# Patient Record
Sex: Female | Born: 1990 | Race: Black or African American | Hispanic: No | Marital: Single | Smoking: Current every day smoker
Health system: Southern US, Community
[De-identification: ages and names within clinical notes are randomized; demographics above are authoritative.]

## PROBLEM LIST (undated history)

## (undated) DIAGNOSIS — N83209 Unspecified ovarian cyst, unspecified side: Secondary | ICD-10-CM

## (undated) HISTORY — PX: OTHER SURGICAL HISTORY: SHX169

## (undated) HISTORY — PX: BREAST SURGERY: SHX581

---
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
## 2009-02-08 ENCOUNTER — Ambulatory Visit: Payer: Self-pay | Admitting: Family Medicine

## 2009-11-01 ENCOUNTER — Emergency Department: Payer: Self-pay | Admitting: Emergency Medicine

## 2010-07-21 ENCOUNTER — Emergency Department: Payer: Self-pay | Admitting: Emergency Medicine

## 2010-10-11 NOTE — H&P (Signed)
NAME:  Haley Osborne, Haley Osborne NO.:  [id number]   MEDICAL RECORD NO.:  [id number]          PATIENT TYPE:  IPS   LOCATION:  0107                          FACILITY:  [facility]   PHYSICIAN:  Lalla Brothers, MDDATE OF BIRTH:  01-Jul-1990   DATE OF ADMISSION:  06/20/2007  DATE OF DISCHARGE:                       PSYCHIATRIC ADMISSION ASSESSMENT   IDENTIFICATION:  A 20 year old female taking 10th and 11th grade classes  at [employer] is admitted emergently involuntarily on an  [facility] petition for commitment upon transfer from [facility] emergency department for inpatient stabilization  and treatment of suicide risk and depression.  The patient lacerated her  left wrist with a razor twice to die surrounding the breakup with  boyfriend and relative rejection by her best girlfriend.  She has been  suicidal the last week and progressively depressed the last 3 months.   HISTORY OF PRESENT ILLNESS:  The patient indicates that she has only two  real friends she is close to and otherwise avoid relationships, as  though having more relationships would devalue the ones she does have.  The patient reports melancholic depressive symptoms over the last 3  months, worse over the last week.  She also appears to have chronic  worry and over the last several months has had limited-symptom panic two  or three times monthly.  The patient stated that she could not take it  anymore.  She is not more specific about stressors, as she seems to find  these more painful and realistic if she talks about them.  In most way  she wants to be left alone, while obviously having the need to talk.  Much of her eccentric style of communication and relation come from  mother having schizophrenia and father having remarried and having other  children.  The patient has therefore become very close to boyfriend with  whom she has been related since the fourth grade.  She also  indicates  that her best girlfriend is named Sheryle Hail and that Sheryle Hail is like her  doctor and her teacher as well as her best friend all wrapped up in one.  The patient indicates that for whatever reason, Sheryle Hail and her  boyfriend have been rejecting or disconnecting from her recently.  The  patient seems to describe that she has become more depressed herself.  The patient reports diminished sleep and appetite with weight loss.  She  indicates diminished concentration and energy as well as diminished  interest.  Her grades are diminished and she has had helplessness and  hopelessness in her despair.  She has had crying spells and  progressively morbid fixations such that on the day of admission she has  fixated upon cutting herself.  The patient has had nightmares and worry.  She has dizzy spells with associated emesis treated with meclizine 25 mg  every 4 hours as needed.  She had been taking ibuprofen for headache.  The patient denies any organic central nervous system trauma.  She does  not acknowledge other specific psychic trauma, though she has had  longstanding and cumulative psychic trauma  associated with family mental  illness and progressive consequences.  The patient does not acknowledge  hallucinations or delusions.  Mother anticipates that the patient will  have schizophrenia, having studied the problem itself since she has it.  Mother will not state what medication she takes.  The patient is  somewhat apprehensive about medication.  She notes that mother smokes  excessively and mother has a significant and recurrent cough on the  phone.  The patient suggests she cannot tolerate or make it in the  hospital, either, as she is not used to talking to more people.  The  patient does not manifest manic diathesis.  There are no dissociative  symptoms or reexperiencing symptoms.  She has had no previous mental  health care.  Her urine drug screen and blood alcohol are negative.   She  smokes two or three cigarettes daily.  Mother has had suicide attempts.  The patient denies that father has any specific symptoms, although she  does not seem as close to father.   PAST MEDICAL HISTORY:  The patient reports having allergic rhinitis with  sinus involvement.  She reports chicken pox in the past.  She has had  irregular menses with last menses being in early December, with the  patient wondering if she is pregnant.  She has had two fibroadenomas  excised from the right breast, apparently in December 2008.  Blood  pressure is 140/90.  Last menses was early December 2008 and menses are  irregular.  The patient has negative urine pregnancy test.  Urinalysis  is normal with specific gravity of 1.015, though having 3+ ketones,  protein of 25, 0-5 wbc, and a few bacteria.  She has no medication  allergies.  She has some intertriginous rash over both medial thighs.  She has no seizure or syncope.  She has no heart murmur or arrhythmia.   REVIEW OF SYSTEMS:  The patient denies difficulty with gait, gaze or  continence.  She denies exposure to communicable disease or toxins.  She  denies rash, jaundice or purpura currently.  There is no chest pain,  palpitations or presyncope.  There is no abdominal pain, nausea,  vomiting or diarrhea.  There is no cough, congestion, dyspnea or  tachypnea, though mother has a chronic cough that patient attributes to  smoking cigarettes.  There is no dysuria or arthralgia.  There is no  headache or memory loss.  There is no sensory loss or coordination  deficit.   Immunizations are up-to-date.   FAMILY HISTORY:  Mother has schizophrenia and a history of suicide  attempts.  Father is remarried, having children with his current wife.  The patient's boyfriend, Kathlene November, and her best female friend, Sheryle Hail, have  both become alienating and detached from the patient recently.   SOCIAL AND DEVELOPMENTAL HISTORY:  The patient is taking 10th and 11th   grade classes at [employer].  She has been missing class  frequently.  She likes to sing.  The patient does not acknowledge sexual  activity but then indicates that she has frequent yeast infections and  needs treatment currently.  She denies alcohol or illicit drugs  currently, though she does smoke two to three cigarettes daily.  Urine  drug screen and blood alcohol are negative.   ASSETS:  The patient is social and sings.   MENTAL STATUS EXAM:  Height is 153.5 cm and weight is 48.5 kg.  Blood  pressure is 127/80 with heart rate of 95 sitting,  and 130/79 with heart  rate of 94 standing.  She is right-handed.  She is alert and oriented  with speech intact.  Cranial nerves II-XII are intact.  Muscle strength  and tone are normal.  There are no pathologic reflexes or soft  neurologic findings.  There are no abnormal involuntary movements.  Gait  and gaze are intact.  The patient is avoidant and inhibited as well as  withdrawn initially.  She appears significantly depressed and she  describes generalized anxiety.  She has diminished concentration,  interest, energy, eating and sleep.  She is helpless and hopeless with  diminished grades and states she cannot take it anymore.  She reports  weight loss, nightmares, crying spells and anxiety attacks two or three  times monthly.  She denies hallucinations and manifests no delusions or  paranoia.  Though symptoms could represent negative schizophrenic  symptoms, the patient does not manifest other features of schizophrenia,  though mother is watching the patient closely for these.  Rather, the  patient presents with severe dysphoria and moderate generalized anxiety  that is more severe at times.  She has no manic diathesis.  There is no  dissociation.  There is no other organicity.  The patient has cut her  wrist, acting upon a week-long suicidal ideation and depression of 3  months.   IMPRESSION:  AXIS I:  1. Major depression,  single episode, severe.  2. Generalized anxiety disorder.  3. Other interpersonal problem.  4. Parent-child problem.  5. Other specified family circumstances.  AXIS II:  Diagnosis deferred.  AXIS III:  1. Self-inflicted lacerations left wrist.  2. Irregular menses.  3. Dermatitis both thighs, likely intertriginous and/or monilial.  4. Cigarette smoking.  5. Recurrent monilial vaginitis by history.  6. Constipation.  AXIS IV:  Stressors family severe acute and chronic; phase of life  severe acute and chronic; peer relations severe acute and chronic.  AXIS V:  Global assessment of functioning on admission is 34 with  highest in last year 65.   PLAN:  The patient is admitted for inpatient adolescent psychiatric and  multidisciplinary multimodal behavioral health treatment in a team-based  programmatic locked psychiatric unit.  Celexa pharmacotherapy is  discussed with the patient and mother including FDA guidelines and  warnings.  They agree to starting 20 mg nightly.  Colace is ordered for  constipation rather than previous the lactulose that the patient  requests.  Cognitive behavioral therapy, anger management, interpersonal  therapy, grief and loss, object relations, individuation separation,  identity consolidation, social and communication skill training, and  problem-solving and coping-skill training can be undertaken.  Estimated  length stay is 7 days with target symptoms for discharge being  stabilization of suicide risk and mood, stabilization of generalized  anxiety, and generalization of the capacity for safe effective  participation in outpatient treatment.      Lalla Brothers, MD  Electronically Signed     GEJ/MEDQ  D:  06/21/2007  T:  06/22/2007  Job:  [id number]

## 2010-10-14 NOTE — Discharge Summary (Signed)
NAME:  Haley Osborne, Haley Osborne NO.:  [id number]   MEDICAL RECORD NO.:  [id number]          PATIENT TYPE:  INP   LOCATION:  0107                          FACILITY:  [facility]   PHYSICIAN:  Lalla Brothers, MDDATE OF BIRTH:  Mar 18, 1991   DATE OF ADMISSION:  06/20/2007  DATE OF DISCHARGE:  06/27/2007                               DISCHARGE SUMMARY   IDENTIFICATION:  [age]-year-old female, having 10th and 11th grade  classes at [employer], was admitted emergently involuntarily on  an [facility] petition for commitment upon transfer from [facility] Emergency Department for inpatient stabilization  and treatment of suicide risk and depression.  The patient lacerated her  left wrist with a razor twice in order to die, surrounding the breakup  with a boyfriend and the relative rejection by her best girlfriend.  She  had been suicidal over the last week and progressively depressed for the  less 3 months.  For full details, please see the typed admission  assessment.   SYNOPSIS OF PRESENT ILLNESS:  The patient resides with mother, who has  chronic schizophrenia, and 1 of 2 older brothers.  The patient suggests  that mother's schizophrenia started when she was poisoned [place] with  her husband, with symptoms coming on when she returned home.  Maternal  grandfather apparently also had schizophrenia.  Mother has concern at  times whether the patient will have schizophrenia.  The patient has some  contact with father, though he does not seem to follow through or  maintain consistent communication.  The patient has significant  caretaking for mother, who has cough from smoking and some urinary  incontinence possibly from alcohol according to the patient, though  mother denies.  Grades have been decreasing and the patient describes  melancholic depressive symptoms.  Still, she is opposed to being in the  hospital and working on these problems.   She has had worry and limited  symptom panic up to 3 times weekly, though without fully established  panic anxiety.  She describes an eccentric interpersonal style, limiting  herself to 2 friends mainly, as though she should not talk to others.  She suggests she talks only to these close friends and not significantly  to family.  She had been close to boyfriend since the 4th grade.  Mother  has had suicide attempts.  The patient has had excision of 3 breast  fibroadenomas as well as a irregular menses that may worry her as well.  The patient wonders if she is pregnant at the time of admission.  Father  has remarried, having children with his subsequent wife.   INITIAL MENTAL STATUS EXAM:  The patient appeared significantly  depressed and describes generalized anxiety as well, likely more  longstanding.  She is avoidant and inhibited, maintaining withdrawal.  She has diminished concentration, interest, energy, eating and sleep.  She presents hopeless and helpless with nightmares, weight loss, crying  spells and negative fixations.  She does not have psychosis or manic  symptoms.  There is no dissociation or post-traumatic stress.  There is  no  organicity evident.   LABORATORY FINDINGS:  At [facility] Emergency Department, CBC was normal  with white count 4900, hemoglobin 12.8, MCV of 89, MCH of 29.8 and  platelet count 299,000.  Comprehensive metabolic panel was normal,  except total bilirubin was slightly elevated at 1.2 with upper limit of  normal of 1 and CO2 of 30 with upper limit of normal 25.  Sodium was  normal at 139, potassium 3.4, random glucose 81, creatinine 0.93,  albumin 4.4, total protein 8.2, AST 16 and ALT 29.  Blood alcohol was  negative.  Urine drug screen was positive for cannabinoids, otherwise  negative.  Urine pregnancy test was negative.  Urinalysis was a poor  clean catch with specific gravity of 1.015 and 1+ bilirubin, 3+ ketones,  pH 7, protein of 25 mg/dL, many  epithelial, greater than 30 per high-  powered field, 0-5 wbc's and rbc's and 1+ bacteria.  At the behavioral  health center, hepatic function panel revealed indirect bilirubin to be  elevated at 1.2, with upper limit of normal 0.9 and direct bilirubin was  normal at 0.2.  AST remained normal at 21 and ALT 13 with GGT 20 and  albumin 4.2.  Free T4 was normal at 1.4 and TSH at 2.113.  RPR was  nonreactive and urine probe for gonorrhea and Chlamydia trachomatis by  DNA amplification were both negative.   HOSPITAL COURSE AND TREATMENT:  General medical exam by Jorje Guild, PA-C  noted that the patient has been taking meclizine 25 mg every 4-6 hours  as needed for dizziness that she thinks may be associated with anxiety  or blood pressure.  She reported constipation for the last 3 days.  She  reported menarche at age 66 with irregular menses, with last menses 7 or  8 weeks ago.  She had some superficial self-inflicted lacerations on the  left forearm and a missing left lower molar tooth.  She acknowledged  sexual activity, with last GYN exam in June 2008 with Dr. Wilford Corner.  The patient had multiple somatic complaints during the hospital stay  varying from nausea and vomiting with dizziness to vaginal discharge,  often for which she expected discharge.  She would often leave group to  check on these after initially predicting that she would not be able to  tolerate group or milieu therapies.  Her multiple somatic complaints  resulted in medical service treating her at various times with Advil,  Colace, Diflucan, Phenergan, MiraLax, Eucerin cream, Vistaril.  As the  patient's anxiety and depression began to improve, her somatic  complaints declined, though she had to  first decide to stay at the  hospital instead of expecting discharge.  The patient did gradually  engage in treatment.  She was afebrile throughout hospital stay with  maximum temperature 98.4.  Her height was 153.5 cm with  admission weight  and discharge weight being 48.5 kg.  Initial supine blood pressure was  112/64 with heart rate of 125 and standing blood pressure 120/75 with a  heart rate of 107.  At the time of discharge, supine blood pressure was  97/63 with heart rate of 68 and standing blood pressure 106/64 with  heart rate of 79.  The patient's multiple medication p.r.n.'s were  consolidated into restarting her meclizine at 25 mg three times daily.  She was started on Celexa after review with the patient and mother  including FDA guidelines and warnings, side-effects and proper use and  monitoring.  Celexa was titrated  up to 40 mg by June 25, 2007, taken  at bedtime and the patient reported that the increase in dose helped  significantly after the lower dose was not helping.  The patient  expected p.r.n. medication for tantrums when she received restriction  status due to receiving a phone call through her grandmother by her ex-  boyfriend, which is prohibited.  The patient worked through such  tantrums to become much more effective in the treatment process.  However, she was still having insomnia by the time of discharge and with  her dizziness problems before admission partially stabilized by  meclizine, temazepam p.r.n. seemed best.  Mother's social worker was  going to bring mother for discharge and she needed much hospital social  work help to secure the planned discharge.  Ultimately, mother did come  for discharge on the day scheduled after discussing whether grandmother  might do that.  Mother was accompanied by Child Protective Services at  pickup and the patient was pleased to be returning home.  Her wounds  were healing well and required no specialized care, other than  protection from further injury.  She is requesting a recheck by her  gynecologist from last June for vaginal discharge, though she was not  having any complaints for 4 days prior to discharge, as though these may   have been significantly associated with anxiety.   FINAL DIAGNOSES:   AXES I:  1. Major depression, single episode, severe, with melancholic      features.  2. Generalized anxiety disorder.  3. Other interpersonal problem.  4. Parent/child problem.  5. Other specified family circumstances.   AXIS II:  Diagnosis deferred.   AXES III:  1. Self-inflicted lacerations, left wrist.  2. Irregular menses.  3. Cigarette smoking.  4. Episodic vaginal discharge responding at a deferred fashion to      treatment for monilial.  5. Constipation  6. Episodic dizziness, most consistent with labyrinthine dysfunction.   AXIS IV:  Stressors, family, severe, acute and chronic; phase of life,  severe, acute and chronic; peer relations, severe, acute and chronic.   AXIS V:  Global assessment of functioning on admission 34 with highest  in last year 65 and discharge global assessment of functioning was 53.   PLAN:  The patient did take part in treatment much more effectively than  she had anticipated on admission.  She is discharged on a regular diet,  having no restrictions on physical activity unless related to episodes  of dizziness.  She requires no wound care, other than protecting the  injuries on the left forearm from any other additional injury.  She may  wish to see her gynecologist, Dr. Marvis Moeller, for followup.  She requires no  pain management.  Crisis and safety plans are outlined if needed.   DISCHARGE MEDICATIONS:  She is discharged on the following medication:  1. Citalopram 40 mg every bedtime, quantity #30 with 1 refill      prescribed.  2. Temazepam 7.5 mg at bedtime if needed for insomnia and dizziness,      quantity #30 with no refill prescribed.  3. Meclizine 25 mg every 6 hours when needed for dizziness and nausea,      currently being taken t.i.d., but being tapered with resolution,      quantity #60 with no refill.   AFTERCARE FOLLOWUP:  Will include her doctor for  dizziness that has also  treated sinus allergies according to the patient in the past.  Mental  health aftercare will be with [facility] , having an intake appointment  with Dallie Dad,  July 01, 2007 at 0930 at [phone].  They are  educated on the medication including FDA guidelines and warnings.      Lalla Brothers, MD  Electronically Signed     GEJ/MEDQ  D:  06/30/2007  T:  07/01/2007  Job:  [id number]   cc:   Eber Hong [address], [place], [place] [address] Dallie Dad,   [place], [facility] [place] [facility] Child [address] Cecilie Lowers Fax [phone], [address]

## 2011-02-17 LAB — HEPATIC FUNCTION PANEL
ALT: 13
AST: 21
Alkaline Phosphatase: 91
Bilirubin, Direct: 0.2
Indirect Bilirubin: 1.2 — ABNORMAL HIGH

## 2011-02-17 LAB — GAMMA GT: GGT: 20

## 2011-02-17 LAB — GC/CHLAMYDIA PROBE AMP, URINE: GC Probe Amp, Urine: NEGATIVE

## 2011-10-22 ENCOUNTER — Emergency Department: Payer: Self-pay | Admitting: Emergency Medicine

## 2011-10-22 LAB — BASIC METABOLIC PANEL
BUN: 13 mg/dL (ref 7–18)
Calcium, Total: 8.6 mg/dL (ref 8.5–10.1)
Chloride: 105 mmol/L (ref 98–107)
Creatinine: 1.04 mg/dL (ref 0.60–1.30)
Glucose: 74 mg/dL (ref 65–99)
Sodium: 140 mmol/L (ref 136–145)

## 2011-10-22 LAB — CBC
MCH: 30.3 pg (ref 26.0–34.0)
MCHC: 33.6 g/dL (ref 32.0–36.0)
MCV: 90 fL (ref 80–100)
RBC: 4.14 10*6/uL (ref 3.80–5.20)
RDW: 13.1 % (ref 11.5–14.5)
WBC: 6.1 10*3/uL (ref 3.6–11.0)

## 2012-03-08 ENCOUNTER — Ambulatory Visit: Payer: Self-pay | Admitting: Family Medicine

## 2012-10-07 ENCOUNTER — Emergency Department: Payer: Self-pay | Admitting: Emergency Medicine

## 2012-10-07 LAB — URINALYSIS, COMPLETE
Bilirubin,UR: NEGATIVE
Blood: NEGATIVE
Glucose,UR: NEGATIVE mg/dL (ref 0–75)
Ketone: NEGATIVE
Nitrite: NEGATIVE
RBC,UR: 2 /HPF (ref 0–5)
Specific Gravity: 1.024 (ref 1.003–1.030)
Squamous Epithelial: 14
WBC UR: 6 /HPF (ref 0–5)

## 2012-10-07 LAB — COMPREHENSIVE METABOLIC PANEL
Bilirubin,Total: 0.5 mg/dL (ref 0.2–1.0)
Calcium, Total: 8.8 mg/dL (ref 8.5–10.1)
Chloride: 107 mmol/L (ref 98–107)
Co2: 25 mmol/L (ref 21–32)
EGFR (Non-African Amer.): >60
Glucose: 76 mg/dL (ref 65–99)
Osmolality: 273 (ref 275–301)
SGOT(AST): 15 U/L (ref 15–37)
SGPT (ALT): 15 U/L (ref 12–78)

## 2012-10-07 LAB — HCG, QUANTITATIVE, PREGNANCY: Beta Hcg, Quant.: 5326 m[IU]/mL — ABNORMAL HIGH

## 2012-10-07 LAB — CBC
HCT: 35.5 % (ref 35.0–47.0)
MCH: 30.3 pg (ref 26.0–34.0)
MCV: 90 fL (ref 80–100)
RDW: 13.2 % (ref 11.5–14.5)

## 2012-10-07 LAB — WET PREP, GENITAL

## 2012-10-10 ENCOUNTER — Other Ambulatory Visit: Payer: Self-pay | Admitting: Obstetrics and Gynecology

## 2012-10-17 ENCOUNTER — Ambulatory Visit: Payer: Self-pay | Admitting: Obstetrics and Gynecology

## 2012-12-07 ENCOUNTER — Emergency Department: Payer: Self-pay | Admitting: Emergency Medicine

## 2012-12-07 LAB — COMPREHENSIVE METABOLIC PANEL
Albumin: 3.1 g/dL — ABNORMAL LOW (ref 3.4–5.0)
Alkaline Phosphatase: 55 U/L (ref 50–136)
BUN: 7 mg/dL (ref 7–18)
Calcium, Total: 8.9 mg/dL (ref 8.5–10.1)
Chloride: 103 mmol/L (ref 98–107)
Co2: 24 mmol/L (ref 21–32)
EGFR (Non-African Amer.): >60
Glucose: 109 mg/dL — ABNORMAL HIGH (ref 65–99)
Osmolality: 272 (ref 275–301)
Potassium: 3.3 mmol/L — ABNORMAL LOW (ref 3.5–5.1)
SGOT(AST): 23 U/L (ref 15–37)
SGPT (ALT): 26 U/L (ref 12–78)
Sodium: 137 mmol/L (ref 136–145)
Total Protein: 6.8 g/dL (ref 6.4–8.2)

## 2012-12-07 LAB — URINALYSIS, COMPLETE
Blood: NEGATIVE
Ketone: NEGATIVE
Ph: 6 (ref 4.5–8.0)
Protein: 30
RBC,UR: 3 /HPF (ref 0–5)
Specific Gravity: 1.025 (ref 1.003–1.030)
WBC UR: 18 /HPF (ref 0–5)

## 2012-12-07 LAB — CBC
HCT: 33.8 % — ABNORMAL LOW (ref 35.0–47.0)
MCV: 90 fL (ref 80–100)
Platelet: 285 10*3/uL (ref 150–440)
RBC: 3.75 10*6/uL — ABNORMAL LOW (ref 3.80–5.20)
RDW: 12.9 % (ref 11.5–14.5)
WBC: 10.5 10*3/uL (ref 3.6–11.0)

## 2012-12-07 LAB — HCG, QUANTITATIVE, PREGNANCY: Beta Hcg, Quant.: 110362 m[IU]/mL — ABNORMAL HIGH

## 2012-12-08 LAB — GC/CHLAMYDIA PROBE AMP

## 2012-12-10 LAB — URINE CULTURE

## 2014-05-26 IMAGING — CR DG CHEST 2V
1 series · 2 of 2 positions shown · non-contrast
Comparison: none

REASON FOR EXAM: productive cough, shortness of breath---denies
possibility of pregnancy
COMMENTS:   LMP: Three weeks ago

PROCEDURE:     DXR - DXR CHEST PA (OR AP) AND LATERAL  - October 22, 2011  [DATE]
RESULT:     The lung fields are clear. No pneumonia, pneumothorax or pleural
effusion is seen. The heart size is normal. Incidental note is made of a
slight lumbar scoliosis with convexity to the left.

[Series 1: w chest pa · 0.14mm/px · 2 of 2 slices shown]
[im 1/2]
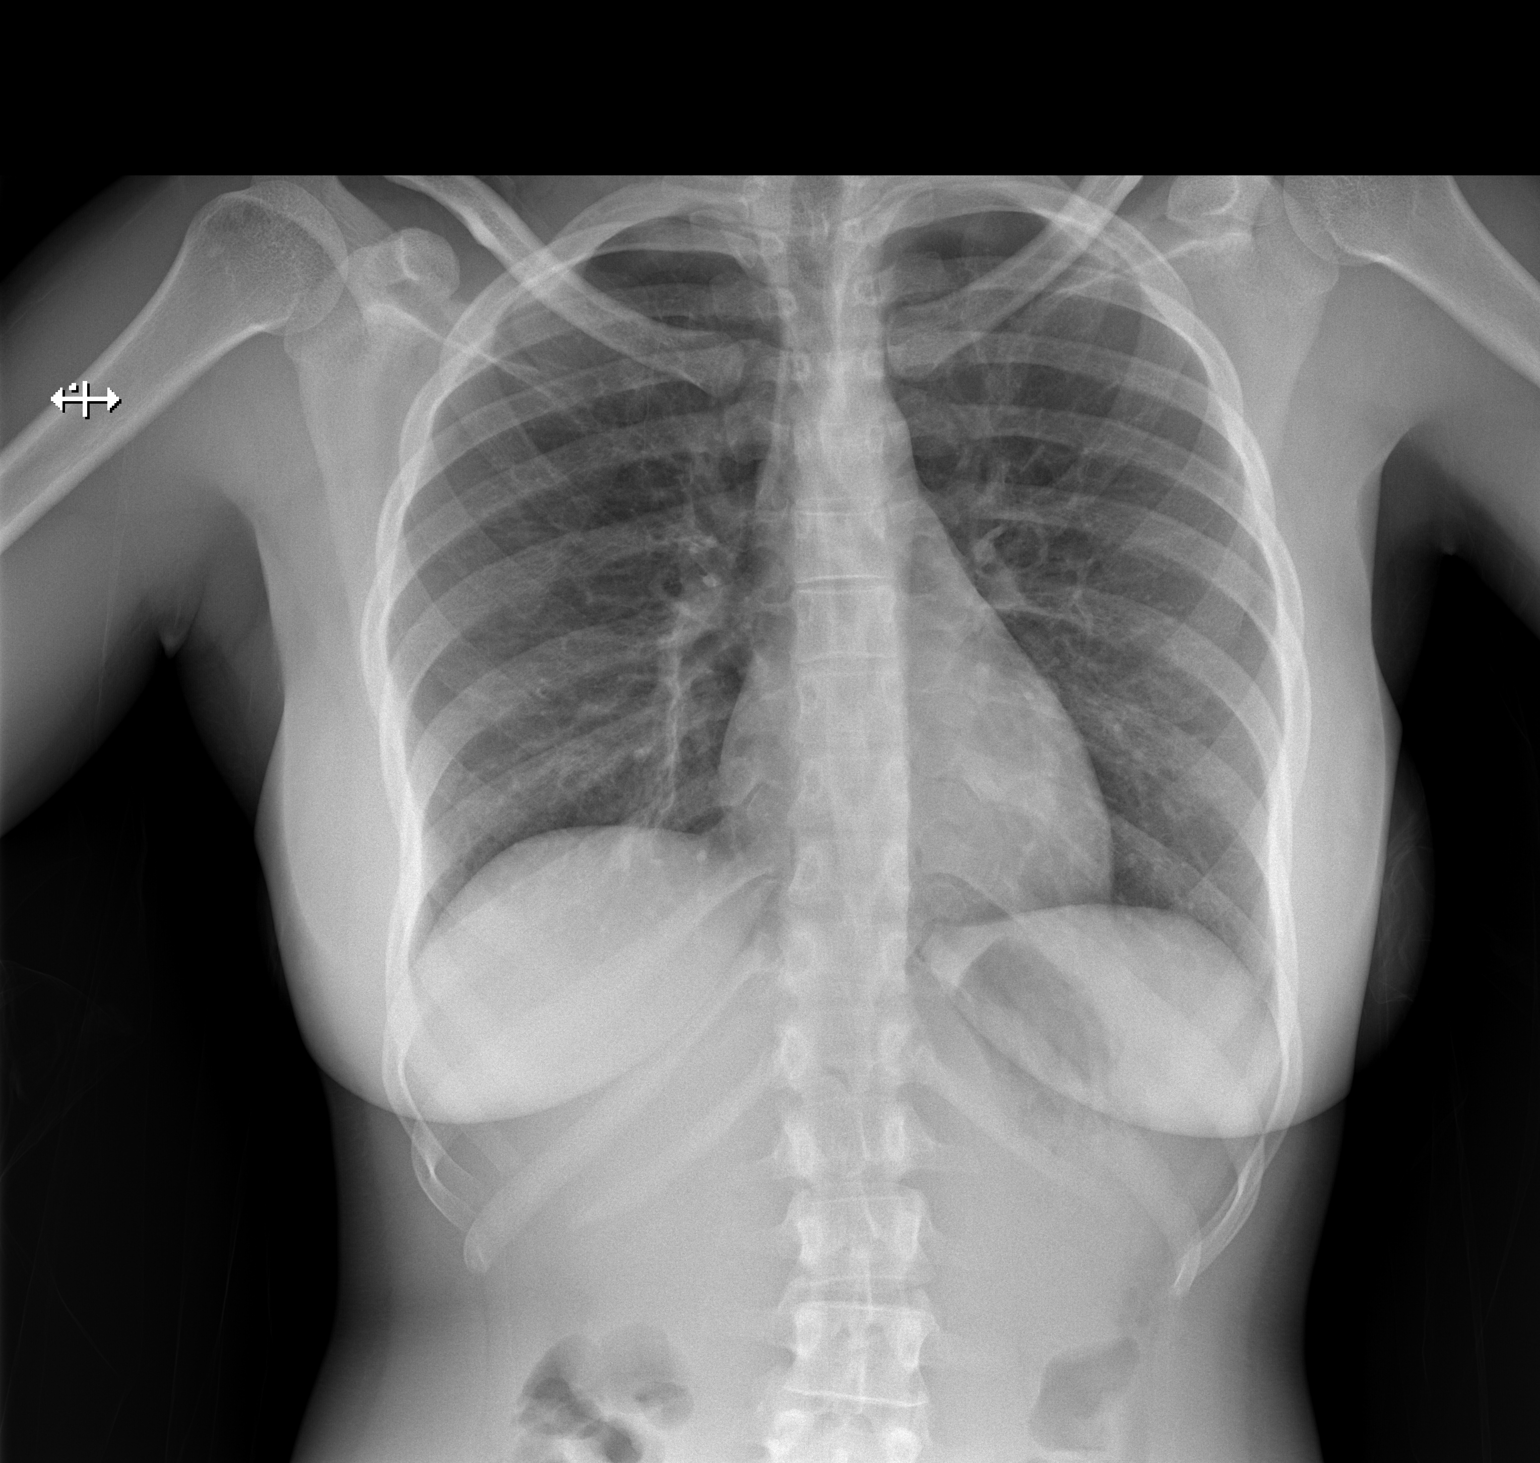
[im 2/2]
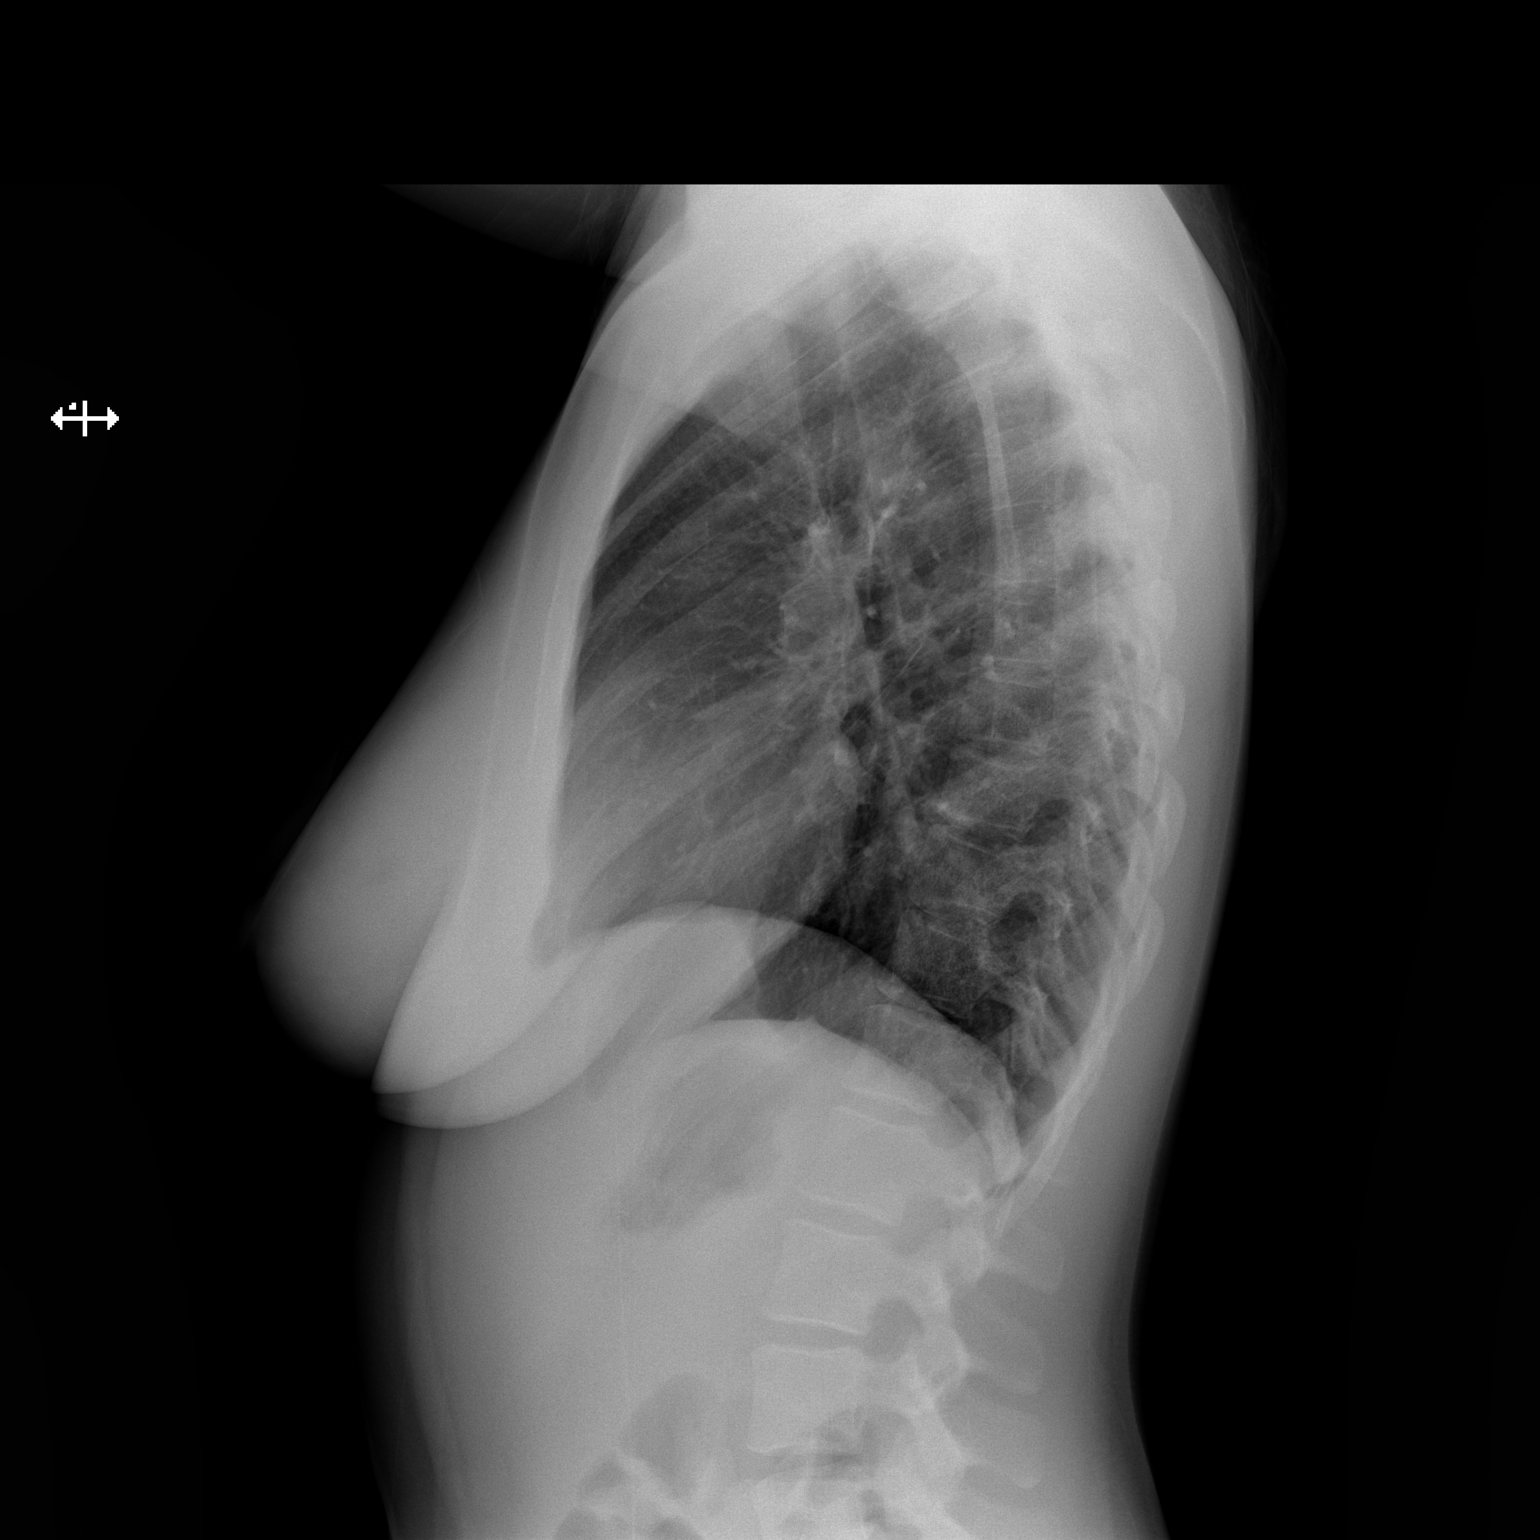

[2 of 2 positions shown; findings below may reference images not displayed]

IMPRESSION: No acute changes are identified.

Dictation site #2.

## 2014-08-01 ENCOUNTER — Emergency Department: Payer: Self-pay | Admitting: Emergency Medicine

## 2014-10-27 ENCOUNTER — Emergency Department
Admission: EM | Admit: 2014-10-27 | Discharge: 2014-10-27 | Discharge status: Against Medical Advice | Payer: Medicaid Other | Attending: Emergency Medicine | Admitting: Emergency Medicine

## 2014-10-27 DIAGNOSIS — R0602 Shortness of breath: Secondary | ICD-10-CM | POA: Insufficient documentation

## 2014-10-27 NOTE — ED Notes (Signed)
Pt here with c/o headache and SOB. Pt stats that she called EMS for the SOB tonight, but then refused transport. Pt states that she has periods of SOB and then it stops. Pt also have periods of felling like the room is spining. Pt was ambulatory to treatment room, pts vital stable at this time. Pt has also been vomiting.

## 2015-03-05 ENCOUNTER — Emergency Department
Admission: EM | Admit: 2015-03-05 | Discharge: 2015-03-05 | Disposition: A | Discharge status: Home / Self Care | Payer: Medicaid Other | Attending: Emergency Medicine | Admitting: Emergency Medicine

## 2015-03-05 ENCOUNTER — Encounter: Payer: Self-pay | Admitting: *Deleted

## 2015-03-05 ENCOUNTER — Emergency Department: Payer: Medicaid Other

## 2015-03-05 DIAGNOSIS — O9989 Other specified diseases and conditions complicating pregnancy, childbirth and the puerperium: Secondary | ICD-10-CM | POA: Diagnosis not present

## 2015-03-05 DIAGNOSIS — R102 Pelvic and perineal pain: Secondary | ICD-10-CM | POA: Diagnosis not present

## 2015-03-05 DIAGNOSIS — Z3A01 Less than 8 weeks gestation of pregnancy: Secondary | ICD-10-CM | POA: Diagnosis not present

## 2015-03-05 DIAGNOSIS — O26899 Other specified pregnancy related conditions, unspecified trimester: Secondary | ICD-10-CM

## 2015-03-05 DIAGNOSIS — O99331 Smoking (tobacco) complicating pregnancy, first trimester: Secondary | ICD-10-CM | POA: Diagnosis not present

## 2015-03-05 DIAGNOSIS — F17210 Nicotine dependence, cigarettes, uncomplicated: Secondary | ICD-10-CM | POA: Diagnosis not present

## 2015-03-05 LAB — COMPREHENSIVE METABOLIC PANEL
ALBUMIN: 3.8 g/dL (ref 3.5–5.0)
ALK PHOS: 54 U/L (ref 38–126)
ALT: 13 U/L — ABNORMAL LOW (ref 14–54)
ANION GAP: 7 (ref 5–15)
AST: 19 U/L (ref 15–41)
BILIRUBIN TOTAL: 0.7 mg/dL (ref 0.3–1.2)
BUN: 11 mg/dL (ref 6–20)
CALCIUM: 9 mg/dL (ref 8.9–10.3)
CO2: 25 mmol/L (ref 22–32)
Chloride: 106 mmol/L (ref 101–111)
Creatinine, Ser: 0.62 mg/dL (ref 0.44–1.00)
GFR calc Af Amer: >60 mL/min (ref >60)
GFR calc non Af Amer: >60 mL/min (ref >60)
GLUCOSE: 83 mg/dL (ref 65–99)
Potassium: 3.6 mmol/L (ref 3.5–5.1)
Sodium: 138 mmol/L (ref 135–145)
TOTAL PROTEIN: 6.4 g/dL — AB (ref 6.5–8.1)

## 2015-03-05 LAB — URINALYSIS COMPLETE WITH MICROSCOPIC (ARMC ONLY)
Bacteria, UA: NONE SEEN
Bilirubin Urine: NEGATIVE
GLUCOSE, UA: NEGATIVE mg/dL
Hgb urine dipstick: NEGATIVE
KETONES UR: NEGATIVE mg/dL
NITRITE: NEGATIVE
Protein, ur: NEGATIVE mg/dL
RBC / HPF: NONE SEEN RBC/hpf (ref 0–5)
SPECIFIC GRAVITY, URINE: 1.009 (ref 1.005–1.030)
pH: 7 (ref 5.0–8.0)

## 2015-03-05 LAB — TYPE AND SCREEN
ABO/RH(D): O POS
Antibody Screen: NEGATIVE

## 2015-03-05 LAB — CBC
HCT: 33 % — ABNORMAL LOW (ref 35.0–47.0)
HEMOGLOBIN: 10.9 g/dL — AB (ref 12.0–16.0)
MCH: 29.5 pg (ref 26.0–34.0)
MCHC: 33.1 g/dL (ref 32.0–36.0)
MCV: 89.2 fL (ref 80.0–100.0)
Platelets: 268 10*3/uL (ref 150–440)
RBC: 3.7 MIL/uL — ABNORMAL LOW (ref 3.80–5.20)
RDW: 13.7 % (ref 11.5–14.5)
WBC: 5.6 10*3/uL (ref 3.6–11.0)

## 2015-03-05 LAB — WET PREP, GENITAL
CLUE CELLS WET PREP: NONE SEEN
Trich, Wet Prep: NONE SEEN
YEAST WET PREP: NONE SEEN

## 2015-03-05 LAB — POCT PREGNANCY, URINE: PREG TEST UR: POSITIVE — AB

## 2015-03-05 LAB — ABO/RH: ABO/RH(D): O POS

## 2015-03-05 LAB — CHLAMYDIA/NGC RT PCR (ARMC ONLY)
Chlamydia Tr: NOT DETECTED
N GONORRHOEAE: NOT DETECTED

## 2015-03-05 LAB — LIPASE, BLOOD: Lipase: 28 U/L (ref 22–51)

## 2015-03-05 LAB — HCG, QUANTITATIVE, PREGNANCY: HCG, BETA CHAIN, QUANT, S: 2283 m[IU]/mL — AB (ref <5)

## 2015-03-05 MED ORDER — MORPHINE SULFATE (PF) 2 MG/ML IV SOLN
2.0000 mg | Freq: Once | INTRAVENOUS | Status: AC
  Administered 2015-03-05: 2 mg via INTRAVENOUS
  Filled 2015-03-05: qty 1

## 2015-03-05 MED ORDER — GNP PRENATAL VITAMINS 28-0.8 MG PO TABS: 1.0000 | ORAL_TABLET | Freq: Every day | ORAL | Status: DC

## 2015-03-05 NOTE — ED Provider Notes (Addendum)
[facility] Emergency Department Provider Note  ____________________________________________   I have reviewed the triage vital signs and the nursing notes.   HISTORY  Chief Complaint Abdominal Pain    HPI Haley Osborne is a 24 y.o. female presents today complaining of pelvic pain. She has had cramping for the last several weeks. Had spotting a few weeks ago. Does have a history of irregular periods. Is known to be Rh+ by outside lab blood work. See care everywhere.The patient has had no fever no chills no nausea or vomiting. She has had increased cramping today. She did not know she was pregnant but her urinary pregnancy test is negative. Her last menstrual period was the August 28, which would make her 5 weeks and 5 days. Patient is G1 P1 with no history of PID that she reports. Patient does not wish pain medication states that the cramping comes and goes and has been going and coming for weeks. No vaginal bleeding today and no presyncopal symptoms no fever no focal pain in the right lower quadrant most the pain is actually the middle to the left   History reviewed. No pertinent past medical history.  There are no active problems to display for this patient.   History reviewed. No pertinent past surgical history.  No current outpatient prescriptions on file.  Allergies Review of patient's allergies indicates no known allergies.  No family history on file.  Social History Social History  Substance Use Topics  . Smoking status: Current Every Day Smoker  . Smokeless tobacco: None  . Alcohol Use: No    Review of Systems Constitutional: No fever/chills Eyes: No visual changes. ENT: No sore throat. No stiff neck no neck pain Cardiovascular: Denies chest pain. Respiratory: Denies shortness of breath. Gastrointestinal:   no vomiting.  No diarrhea.  No constipation. Genitourinary: Negative for dysuria. Musculoskeletal: Negative lower extremity  swelling Skin: Negative for rash. Neurological: Negative for headaches, focal weakness or numbness. 10-point ROS otherwise negative.  ____________________________________________   PHYSICAL EXAM:  VITAL SIGNS: ED Triage Vitals  Enc Vitals Group     BP 03/05/15 1519 109/59 mmHg     Pulse Rate 03/05/15 1519 70     Resp 03/05/15 1519 16     Temp 03/05/15 1519 98 F (36.7 C)     Temp Source 03/05/15 1519 Oral     SpO2 03/05/15 1519 98 %     Weight --      Height --      Head Cir --      Peak Flow --      Pain Score 03/05/15 1516 8     Pain Loc --      Pain Edu? --      Excl. in GC? --     Constitutional: Alert and oriented. Well appearing and in no acute distress. Eyes: Conjunctivae are normal. PERRL. EOMI. Head: Atraumatic. Nose: No congestion/rhinnorhea. Mouth/Throat: Mucous membranes are moist.  Oropharynx non-erythematous. Neck: No stridor.   Nontender with no meningismus Cardiovascular: Normal rate, regular rhythm. Grossly normal heart sounds.  Good peripheral circulation. Respiratory: Normal respiratory effort.  No retractions. Lungs CTAB. Gastrointestinal: Soft anminimally tender to palpation in the suprapubic region, no istention. No guarding no rebound Back:  There is no focal tenderness or step off there is no midline tenderness there are no lesions noted. there is no CVA tenderness Pelvic exam: Female nurse chaperone present, no external lesions noted, physiologic vaginal discharge noted with no purulent discharge, no  cervical motion tenderness, no adnexal tenderness or mass, there is mild uterine tenderness or mass. No vaginal bleeding Musculoskeletal: No lower extremity tenderness. No joint effusions, no DVT signs strong distal pulses no edema Neurologic:  Normal speech and language. No gross focal neurologic deficits are appreciated.  Skin:  Skin is warm, dry and intact. No rash noted. Psychiatric: Mood and affect are normal. Speech and behavior are  normal.  ____________________________________________   LABS (all labs ordered are listed, but only abnormal results are displayed)  Labs Reviewed  COMPREHENSIVE METABOLIC PANEL - Abnormal; Notable for the following:    Total Protein 6.4 (*)    ALT 13 (*)    All other components within normal limits  CBC - Abnormal; Notable for the following:    RBC 3.70 (*)    Hemoglobin 10.9 (*)    HCT 33.0 (*)    All other components within normal limits  HCG, QUANTITATIVE, PREGNANCY - Abnormal; Notable for the following:    hCG, Beta Chain, Quant, S 2283 (*)    All other components within normal limits  POCT PREGNANCY, URINE - Abnormal; Notable for the following:    Preg Test, Ur POSITIVE (*)    All other components within normal limits  WET PREP, GENITAL  CHLAMYDIA/NGC RT PCR ([facility] ONLY)  LIPASE, BLOOD  URINALYSIS COMPLETEWITH MICROSCOPIC ([facility] ONLY)  POC URINE PREG, ED  TYPE AND SCREEN  ABO/RH   ____________________________________________  EKG   ____________________________________________  RADIOLOGY   ____________________________________________   PROCEDURES  Procedure(s) performed: None  Critical Care performed: None  ____________________________________________   INITIAL IMPRESSION / ASSESSMENT AND PLAN / ED COURSE  Pertinent labs & imaging results that were available during my care of the patient were reviewed by me and considered in my medical decision making (see chart for details).  There is no evidence of ectopic pregnancy, there is no evidence of PID there is no evidence of appendicitis or other acute intra-abdominal pathology at this time. Patient is having a cramping discomfort in the context of early pregnancy with a identified IUP consistent with dates. However we do not yet see the heartbeat, I will give patient precautions about feeding and increased pain and have her follow up on Monday with OB/GYN. Return precautions have been given and  understood  ----------------------------------------- 9:36 PM on 03/05/2015 -----------------------------------------  Patient eating and drinking no acute distress, serial abdominal exams are reassuring U to go home follow-up stressed, limitations of workup here understood, and return precautions understood ____________________________________________   FINAL CLINICAL IMPRESSION(S) / ED DIAGNOSES  Final diagnoses:  None     Jeanmarie Plant, MD 03/05/15 1610  Jeanmarie Plant, MD 03/05/15 2137

## 2015-03-05 NOTE — ED Notes (Signed)
Pt gone to ultrasound

## 2015-03-05 NOTE — Discharge Instructions (Signed)
Abdominal Pain During Pregnancy At this time, it appears that there is an early pregnancy, if you have increased pain, fever, vomiting, diarrhea, vaginal bleeding especially more than 1 pad an hour, or you feel worse in any way, please return to the emergency room Abdominal pain is common in pregnancy. Most of the time, it does not cause harm. There are many causes of abdominal pain. Some causes are more serious than others. Some of the causes of abdominal pain in pregnancy are easily diagnosed. Occasionally, the diagnosis takes time to understand. Other times, the cause is not determined. Abdominal pain can be a sign that something is very wrong with the pregnancy, or the pain may have nothing to do with the pregnancy at all. For this reason, always tell your health care provider if you have any abdominal discomfort. HOME CARE INSTRUCTIONS  Monitor your abdominal pain for any changes. The following actions may help to alleviate any discomfort you are experiencing:  Do not have sexual intercourse or put anything in your vagina until your symptoms go away completely.  Get plenty of rest until your pain improves.  Drink clear fluids if you feel nauseous. Avoid solid food as long as you are uncomfortable or nauseous.  Only take over-the-counter or prescription medicine as directed by your health care provider.  Keep all follow-up appointments with your health care provider. SEEK IMMEDIATE MEDICAL CARE IF:  You are bleeding, leaking fluid, or passing tissue from the vagina.  You have increasing pain or cramping.  You have persistent vomiting.  You have painful or bloody urination.  You have a fever.  You notice a decrease in your baby's movements.  You have extreme weakness or feel faint.  You have shortness of breath, with or without abdominal pain.  You develop a severe headache with abdominal pain.  You have abnormal vaginal discharge with abdominal pain.  You have persistent  diarrhea.  You have abdominal pain that continues even after rest, or gets worse. MAKE SURE YOU:   Understand these instructions.  Will watch your condition.  Will get help right away if you are not doing well or get worse.   This information is not intended to replace advice given to you by your health care provider. Make sure you discuss any questions you have with your health care provider.   Document Released: 05/15/2005 Document Revised: 03/05/2013 Document Reviewed: 12/12/2012 Elsevier Interactive Patient Education [employer]

## 2015-03-05 NOTE — ED Notes (Addendum)
Pt reports lower abdominal pain since this am. No nausea/vomiting/diarrhea. No urinary symptoms.

## 2016-03-02 ENCOUNTER — Other Ambulatory Visit: Payer: Self-pay | Admitting: Family Medicine

## 2016-03-02 ENCOUNTER — Ambulatory Visit
Admission: RE | Admit: 2016-03-02 | Discharge: 2016-03-02 | Disposition: A | Discharge status: Home / Self Care | Payer: Self-pay | Source: Ambulatory Visit | Attending: Family Medicine | Admitting: Family Medicine

## 2016-03-02 DIAGNOSIS — O0001 Abdominal pregnancy with intrauterine pregnancy: Secondary | ICD-10-CM

## 2016-03-02 DIAGNOSIS — Z3A01 Less than 8 weeks gestation of pregnancy: Secondary | ICD-10-CM | POA: Insufficient documentation

## 2016-03-02 DIAGNOSIS — O3481 Maternal care for other abnormalities of pelvic organs, first trimester: Secondary | ICD-10-CM | POA: Insufficient documentation

## 2016-03-02 DIAGNOSIS — Z3481 Encounter for supervision of other normal pregnancy, first trimester: Secondary | ICD-10-CM

## 2016-03-02 DIAGNOSIS — N8311 Corpus luteum cyst of right ovary: Secondary | ICD-10-CM | POA: Insufficient documentation

## 2016-05-15 ENCOUNTER — Emergency Department
Admission: EM | Admit: 2016-05-15 | Discharge: 2016-05-15 | Disposition: A | Discharge status: Home / Self Care | Payer: Medicaid Other | Attending: Emergency Medicine | Admitting: Emergency Medicine

## 2016-05-15 ENCOUNTER — Encounter: Payer: Self-pay | Admitting: Emergency Medicine

## 2016-05-15 ENCOUNTER — Emergency Department: Payer: Medicaid Other

## 2016-05-15 DIAGNOSIS — R1031 Right lower quadrant pain: Secondary | ICD-10-CM | POA: Diagnosis not present

## 2016-05-15 DIAGNOSIS — O26892 Other specified pregnancy related conditions, second trimester: Secondary | ICD-10-CM | POA: Insufficient documentation

## 2016-05-15 DIAGNOSIS — R109 Unspecified abdominal pain: Secondary | ICD-10-CM

## 2016-05-15 DIAGNOSIS — R102 Pelvic and perineal pain: Secondary | ICD-10-CM | POA: Insufficient documentation

## 2016-05-15 DIAGNOSIS — Z3A16 16 weeks gestation of pregnancy: Secondary | ICD-10-CM | POA: Insufficient documentation

## 2016-05-15 DIAGNOSIS — Z87891 Personal history of nicotine dependence: Secondary | ICD-10-CM | POA: Diagnosis not present

## 2016-05-15 HISTORY — DX: Unspecified ovarian cyst, unspecified side: N83.209

## 2016-05-15 LAB — COMPREHENSIVE METABOLIC PANEL
ALBUMIN: 3.5 g/dL (ref 3.5–5.0)
ALK PHOS: 50 U/L (ref 38–126)
ALT: 14 U/L (ref 14–54)
AST: 22 U/L (ref 15–41)
Anion gap: 6 (ref 5–15)
BILIRUBIN TOTAL: 0.3 mg/dL (ref 0.3–1.2)
BUN: 9 mg/dL (ref 6–20)
CALCIUM: 9.1 mg/dL (ref 8.9–10.3)
CO2: 27 mmol/L (ref 22–32)
Chloride: 101 mmol/L (ref 101–111)
Creatinine, Ser: 0.58 mg/dL (ref 0.44–1.00)
GFR calc Af Amer: >60 mL/min (ref >60)
GFR calc non Af Amer: >60 mL/min (ref >60)
GLUCOSE: 78 mg/dL (ref 65–99)
Potassium: 3.6 mmol/L (ref 3.5–5.1)
Sodium: 134 mmol/L — ABNORMAL LOW (ref 135–145)
TOTAL PROTEIN: 7 g/dL (ref 6.5–8.1)

## 2016-05-15 LAB — CBC
HCT: 35.7 % (ref 35.0–47.0)
Hemoglobin: 12 g/dL (ref 12.0–16.0)
MCH: 30.4 pg (ref 26.0–34.0)
MCHC: 33.7 g/dL (ref 32.0–36.0)
MCV: 90.3 fL (ref 80.0–100.0)
Platelets: 312 10*3/uL (ref 150–440)
RBC: 3.96 MIL/uL (ref 3.80–5.20)
RDW: 13.6 % (ref 11.5–14.5)
WBC: 8.2 10*3/uL (ref 3.6–11.0)

## 2016-05-15 LAB — URINALYSIS, COMPLETE (UACMP) WITH MICROSCOPIC
Bacteria, UA: NONE SEEN
Bilirubin Urine: NEGATIVE
GLUCOSE, UA: NEGATIVE mg/dL
HGB URINE DIPSTICK: NEGATIVE
Ketones, ur: NEGATIVE mg/dL
NITRITE: NEGATIVE
Protein, ur: NEGATIVE mg/dL
RBC / HPF: NONE SEEN RBC/hpf (ref 0–5)
SPECIFIC GRAVITY, URINE: 1.02 (ref 1.005–1.030)
pH: 6 (ref 5.0–8.0)

## 2016-05-15 LAB — HCG, QUANTITATIVE, PREGNANCY: HCG, BETA CHAIN, QUANT, S: 18475 m[IU]/mL — AB (ref <5)

## 2016-05-15 LAB — LIPASE, BLOOD: Lipase: 16 U/L (ref 11–51)

## 2016-05-15 MED ORDER — ACETAMINOPHEN 500 MG PO TABS
1000.0000 mg | ORAL_TABLET | Freq: Once | ORAL | Status: AC
  Administered 2016-05-15: 1000 mg via ORAL

## 2016-05-15 MED ORDER — ACETAMINOPHEN 500 MG PO TABS
ORAL_TABLET | ORAL | Status: AC
  Administered 2016-05-15: 1000 mg via ORAL
  Filled 2016-05-15: qty 2

## 2016-05-15 NOTE — ED Provider Notes (Signed)
[facility] Emergency Department Provider Note   ____________________________________________   I have reviewed the triage vital signs and the nursing notes.   HISTORY  Chief Complaint Abdominal Pain   History limited by: Not Limited   HPI Haley Osborne is a 25 y.o. female, at roughly [redacted] weeks pregnant, who presents to the emergency department today at the request of her PCP's office because of concern for right lower quadrant pain. The patient went to her PCP's office this morning for the pain. It started yesterday evening. Has been somewhat intermittent since then. It will become severe. The patient denies any unusual activity recently. It is worse with standing. No nausea or vomiting. No fevers. No abnormal vaginal discharge or dysuria.    Past Medical History:  Diagnosis Date  . Ovarian cyst     There are no active problems to display for this patient.   Past Surgical History:  Procedure Laterality Date  . BREAST SURGERY    . miscarriages      Prior to Admission medications   Medication Sig Start Date End Date Taking? Authorizing Provider  Prenatal Vit-Fe Fumarate-FA (GNP PRENATAL VITAMINS) 28-0.8 MG TABS Take 1 tablet by mouth daily. 03/05/15   Jeanmarie PlantJames A McShane, MD    Allergies Patient has no known allergies.  No family history on file.  Social History Social History  Substance Use Topics  . Smoking status: Former Games developermoker  . Smokeless tobacco: Not on file  . Alcohol use No    Review of Systems  Constitutional: Negative for fever. Cardiovascular: Negative for chest pain. Respiratory: Negative for shortness of breath. Gastrointestinal: Positive for right lower quadrant pain. Genitourinary: Negative for dysuria. Musculoskeletal: Negative for back pain. Skin: Negative for rash. Neurological: Negative for headaches, focal weakness or numbness.  10-point ROS otherwise  negative.  ____________________________________________   PHYSICAL EXAM:  VITAL SIGNS: ED Triage Vitals  Enc Vitals Group     BP 05/15/16 1132 107/62     Pulse Rate 05/15/16 1132 98     Resp 05/15/16 1132 20     Temp 05/15/16 1132 98.4 F (36.9 C)     Temp Source 05/15/16 1132 Oral     SpO2 05/15/16 1132 98 %     Weight 05/15/16 1133 137 lb (62.1 kg)     Height 05/15/16 1133 4\' 11"  (1.499 m)     Head Circumference --      Peak Flow --      Pain Score 05/15/16 1133 8    Constitutional: Alert and oriented. Well appearing and in no distress. Eyes: Conjunctivae are normal. Normal extraocular movements. ENT   Head: Normocephalic and atraumatic.   Nose: No congestion/rhinnorhea.   Mouth/Throat: Mucous membranes are moist.   Neck: No stridor. Hematological/Lymphatic/Immunilogical: No cervical lymphadenopathy. Cardiovascular: Normal rate, regular rhythm.  No murmurs, rubs, or gallops.  Respiratory: Normal respiratory effort without tachypnea nor retractions. Breath sounds are clear and equal bilaterally. No wheezes/rales/rhonchi. Gastrointestinal: Soft. Gravid. Tender to palpation in the right lower quadrant. Genitourinary: Deferred Musculoskeletal: Normal range of motion in all extremities. No lower extremity edema. Neurologic:  Normal speech and language. No gross focal neurologic deficits are appreciated.  Skin:  Skin is warm, dry and intact. No rash noted. Psychiatric: Mood and affect are normal. Speech and behavior are normal. Patient exhibits appropriate insight and judgment.  ____________________________________________    LABS (pertinent positives/negatives)  Labs Reviewed  COMPREHENSIVE METABOLIC PANEL - Abnormal; Notable for the following:  Result Value   Sodium 134 (*)    All other components within normal limits  URINALYSIS, COMPLETE (UACMP) WITH MICROSCOPIC - Abnormal; Notable for the following:    Color, Urine YELLOW (*)    APPearance HAZY  (*)    Leukocytes, UA SMALL (*)    Squamous Epithelial / LPF 6-30 (*)    All other components within normal limits  HCG, QUANTITATIVE, PREGNANCY - Abnormal; Notable for the following:    hCG, Beta Chain, Quant, S 18,475 (*)    All other components within normal limits  LIPASE, BLOOD  CBC     ____________________________________________   EKG  None  ____________________________________________    RADIOLOGY  US abd IMPRESSION:  1. Single living intrauterine pregnancy with gestational age of [redacted]  weeks 2 days.  2. No evidence of right ovarian torsion. Left ovary not visualized.      ____________________________________________   PROCEDURES  Procedures  ____________________________________________   INITIAL IMPRESSION / ASSESSMENT AND PLAN / ED COURSE  Pertinent labs & imaging results that were available during my care of the patient were reviewed by me and considered in my medical decision making (see chart for details).  Patient presented for primary care doctor's office because of concerns for right lower quadrant abdominal pain. Exam she is minimally tender there. Ultrasound was performed which did not show any acute abnormality. At this point had a discussion with the patient. We discussed possibility of appendicitis. This point patient is afebrile. She has no leukocytosis. No nausea or vomiting. Patient was comfortable not pursuing any further diagnostics at this point. We did discuss many of these symptoms since return precautions.  ____________________________________________   FINAL CLINICAL IMPRESSION(S) / ED DIAGNOSES  Final diagnoses:  Abdominal pain     Note: This dictation was prepared with Dragon dictation. Any transcriptional errors that result from this process are unintentional     Phineas SemenGraydon Qamar Aughenbaugh, MD 05/15/16 1704

## 2016-05-15 NOTE — ED Triage Notes (Signed)
Lower R abdominal since last night. [redacted] weeks pregnant. 4th pregnancy. No vaginal bleeding or fluid leaking. Denies dysuria. Has had abdominal craimping for entire pregnancy.

## 2016-05-15 NOTE — Discharge Instructions (Signed)
Please seek medical attention for any high fevers, chest pain, shortness of breath, change in behavior, persistent vomiting, bloody stool or any other new or concerning symptoms.  

## 2016-11-12 ENCOUNTER — Emergency Department
Admission: EM | Admit: 2016-11-12 | Discharge: 2016-11-12 | Disposition: A | Discharge status: Home / Self Care | Payer: Medicaid Other | Attending: Emergency Medicine | Admitting: Emergency Medicine

## 2016-11-12 ENCOUNTER — Encounter: Payer: Self-pay | Admitting: Emergency Medicine

## 2016-11-12 DIAGNOSIS — J029 Acute pharyngitis, unspecified: Secondary | ICD-10-CM | POA: Diagnosis present

## 2016-11-12 DIAGNOSIS — J020 Streptococcal pharyngitis: Secondary | ICD-10-CM | POA: Diagnosis not present

## 2016-11-12 MED ORDER — AMOXICILLIN-POT CLAVULANATE 875-125 MG PO TABS: 1.0000 | ORAL_TABLET | Freq: Two times a day (BID) | ORAL | 0 refills | Status: DC

## 2016-11-12 MED ORDER — MAGIC MOUTHWASH W/LIDOCAINE: 10.0000 mL | Freq: Four times a day (QID) | ORAL | 0 refills | Status: DC | PRN

## 2016-11-12 NOTE — ED Provider Notes (Signed)
[facility] Emergency Department Provider Note  ____________________________________________  Time seen: Approximately 7:13 AM  I have reviewed the triage vital signs and the nursing notes.   HISTORY  Chief Complaint Sore Throat    HPI Haley Osborne is a 26 y.o. female who presents to the emergency department for evaluation of sore throat that started yesterday. No known fever. She is 4 weeks postpartum and nursing. She has taken ibuprofen without significant relief.  Past Medical History:  Diagnosis Date  . Ovarian cyst     There are no active problems to display for this patient.   Past Surgical History:  Procedure Laterality Date  . BREAST SURGERY    . miscarriages      Prior to Admission medications   Medication Sig Start Date End Date Taking? Authorizing Provider  amoxicillin-clavulanate (AUGMENTIN) 875-125 MG tablet Take 1 tablet by mouth 2 (two) times daily. 11/12/16   Myalynn Lingle, Rulon Eisenmengerari B, FNP  magic mouthwash w/lidocaine SOLN Take 10 mLs by mouth 4 (four) times daily as needed for mouth pain. 11/12/16   Fielding Mault, Kasandra Knudsenari B, FNP  Prenatal Vit-Fe Fumarate-FA (GNP PRENATAL VITAMINS) 28-0.8 MG TABS Take 1 tablet by mouth daily. 03/05/15   Jeanmarie PlantMcShane, James A, MD    Allergies Shrimp [shellfish allergy]  No family history on file.  Social History Social History  Substance Use Topics  . Smoking status: Former Games developermoker  . Smokeless tobacco: Never Used  . Alcohol use No    Review of Systems Constitutional: Negative for fever. Eyes: No visual changes. ENT: Positive for sore throat; negative for difficulty swallowing. Respiratory: Denies shortness of breath. Gastrointestinal: No abdominal pain.  No nausea, no vomiting. Musculoskeletal: Negataive for generalized body aches. Skin: Negataive for rash. Neurological: Negative for headaches, focal weakness or numbness.  ____________________________________________   PHYSICAL EXAM:  VITAL  SIGNS: ED Triage Vitals  Enc Vitals Group     BP 11/12/16 0705 118/83     Pulse Rate 11/12/16 0705 (!) 105     Resp 11/12/16 0705 20     Temp 11/12/16 0705 99.1 F (37.3 C)     Temp Source 11/12/16 0705 Oral     SpO2 11/12/16 0705 99 %     Weight 11/12/16 0707 148 lb (67.1 kg)     Height 11/12/16 0707 4\' 11"  (1.499 m)     Head Circumference --      Peak Flow --      Pain Score 11/12/16 0705 10     Pain Loc --      Pain Edu? --      Excl. in GC? --    Constitutional: Alert and oriented. Well appearing and in no acute distress. Eyes: Conjunctivae are normal. PERRL. EOMI. Head: Atraumatic. Nose: No congestion/rhinnorhea. Mouth/Throat: Mucous membranes are moist.  Oropharynx erythematous, tonsils 2+ with large amount of exudate. Neck: No stridor.  Lymphatic: Lymphadenopathy: Bilateral anterior cervical lymphadenopathy palpable. Cardiovascular: Normal rate, regular rhythm. Good peripheral circulation. Respiratory: Normal respiratory effort. Lungs CTAB. Gastrointestinal: Soft and nontender. Musculoskeletal: No lower extremity tenderness nor edema.   Neurologic:  Normal speech and language. No gross focal neurologic deficits are appreciated. Speech is normal. No gait instability. Skin:  Skin is warm, dry and intact. No rash noted Psychiatric: Mood and affect are normal. Speech and behavior are normal.  ____________________________________________   LABS (all labs ordered are listed, but only abnormal results are displayed)  Labs Reviewed - No data to display ____________________________________________  EKG   ____________________________________________  RADIOLOGY   ____________________________________________   PROCEDURES  Procedure(s) performed: None  Critical Care performed: No ____________________________________________   INITIAL IMPRESSION / ASSESSMENT AND PLAN / ED COURSE  Pertinent labs & imaging results that were available during my care of the patient  were reviewed by me and considered in my medical decision making (see chart for details).  26 year old female presenting to the ER for evaluation and treatment of sore throat. Exam and symptoms consistent with strep pharyngitis. She will be treated with Augmentin and magic mouthwash without diphenhydramine. She was advised to also take tylenol and/or ibuprofen if needed. She is to return to the ER for symptoms that are not improving over the next 2-3 days if unable to see the PCP. ____________________________________________  Discharge Medication List as of 11/12/2016  7:22 AM    START taking these medications   Details  amoxicillin-clavulanate (AUGMENTIN) 875-125 MG tablet Take 1 tablet by mouth 2 (two) times daily., Starting Sun 11/12/2016, Print    magic mouthwash w/lidocaine SOLN Take 10 mLs by mouth 4 (four) times daily as needed for mouth pain., Starting Sun 11/12/2016, Print        FINAL CLINICAL IMPRESSION(S) / ED DIAGNOSES  Final diagnoses:  Strep pharyngitis    If controlled substance prescribed during this visit, 12 month history viewed on the [facility] prior to issuing an initial prescription for Schedule II or III opiod.   Note:  This document was prepared using Dragon voice recognition software and may include unintentional dictation errors.    Chinita Pester, FNP 11/12/16 1610    Phineas Semen, MD 11/12/16 1226

## 2016-11-12 NOTE — Discharge Instructions (Signed)
You may also take tylenol or ibuprofen in addition to the mouthwash. See your PCP if not improving over the next 2-3 days or return to the ER for symptoms that change or worsen.

## 2016-11-12 NOTE — ED Notes (Signed)
See triage note  States she developed sore throat 2 days ago  Pain increased this am with swallowing  Unsure of fever at home but did have chills  Low grade fever on arrival

## 2016-11-12 NOTE — ED Triage Notes (Signed)
Sore throat since yesterday.

## 2017-03-19 ENCOUNTER — Emergency Department
Admission: EM | Admit: 2017-03-19 | Discharge: 2017-03-19 | Disposition: A | Discharge status: Home / Self Care | Payer: Medicaid Other | Attending: Emergency Medicine | Admitting: Emergency Medicine

## 2017-03-19 ENCOUNTER — Emergency Department: Payer: Medicaid Other

## 2017-03-19 ENCOUNTER — Encounter: Payer: Self-pay | Admitting: Emergency Medicine

## 2017-03-19 DIAGNOSIS — Y999 Unspecified external cause status: Secondary | ICD-10-CM | POA: Diagnosis not present

## 2017-03-19 DIAGNOSIS — Z79899 Other long term (current) drug therapy: Secondary | ICD-10-CM | POA: Diagnosis not present

## 2017-03-19 DIAGNOSIS — F17210 Nicotine dependence, cigarettes, uncomplicated: Secondary | ICD-10-CM | POA: Diagnosis not present

## 2017-03-19 DIAGNOSIS — Y92410 Unspecified street and highway as the place of occurrence of the external cause: Secondary | ICD-10-CM | POA: Insufficient documentation

## 2017-03-19 DIAGNOSIS — S6991XA Unspecified injury of right wrist, hand and finger(s), initial encounter: Secondary | ICD-10-CM

## 2017-03-19 DIAGNOSIS — S63501A Unspecified sprain of right wrist, initial encounter: Secondary | ICD-10-CM | POA: Insufficient documentation

## 2017-03-19 DIAGNOSIS — Y9389 Activity, other specified: Secondary | ICD-10-CM | POA: Insufficient documentation

## 2017-03-19 MED ORDER — MELOXICAM 15 MG PO TABS: 15.0000 mg | ORAL_TABLET | Freq: Every day | ORAL | 0 refills | Status: DC
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
## 2017-03-19 NOTE — ED Notes (Signed)
Right finger #2 oozing blood at tip. Able to move

## 2017-05-09 ENCOUNTER — Encounter: Payer: Self-pay | Admitting: Emergency Medicine

## 2017-05-09 DIAGNOSIS — N8301 Follicular cyst of right ovary: Secondary | ICD-10-CM | POA: Insufficient documentation

## 2017-05-09 DIAGNOSIS — F17210 Nicotine dependence, cigarettes, uncomplicated: Secondary | ICD-10-CM | POA: Diagnosis not present

## 2017-05-09 DIAGNOSIS — Z791 Long term (current) use of non-steroidal anti-inflammatories (NSAID): Secondary | ICD-10-CM | POA: Insufficient documentation

## 2017-05-09 DIAGNOSIS — R102 Pelvic and perineal pain: Secondary | ICD-10-CM | POA: Diagnosis present

## 2017-05-09 LAB — POCT PREGNANCY, URINE: PREG TEST UR: NEGATIVE

## 2017-05-09 LAB — COMPREHENSIVE METABOLIC PANEL
ALK PHOS: 83 U/L (ref 38–126)
ALT: 13 U/L — AB (ref 14–54)
AST: 21 U/L (ref 15–41)
Albumin: 4 g/dL (ref 3.5–5.0)
Anion gap: 9 (ref 5–15)
BILIRUBIN TOTAL: 0.7 mg/dL (ref 0.3–1.2)
BUN: 10 mg/dL (ref 6–20)
CALCIUM: 9.4 mg/dL (ref 8.9–10.3)
CO2: 26 mmol/L (ref 22–32)
CREATININE: 0.77 mg/dL (ref 0.44–1.00)
Chloride: 103 mmol/L (ref 101–111)
Glucose, Bld: 90 mg/dL (ref 65–99)
Potassium: 3.4 mmol/L — ABNORMAL LOW (ref 3.5–5.1)
Sodium: 138 mmol/L (ref 135–145)
TOTAL PROTEIN: 7.1 g/dL (ref 6.5–8.1)

## 2017-05-09 LAB — CBC
HCT: 36.4 % (ref 35.0–47.0)
Hemoglobin: 12.4 g/dL (ref 12.0–16.0)
MCH: 30.4 pg (ref 26.0–34.0)
MCHC: 34.2 g/dL (ref 32.0–36.0)
MCV: 88.8 fL (ref 80.0–100.0)
PLATELETS: 270 10*3/uL (ref 150–440)
RBC: 4.1 MIL/uL (ref 3.80–5.20)
RDW: 12.9 % (ref 11.5–14.5)
WBC: 4.2 10*3/uL (ref 3.6–11.0)

## 2017-05-09 LAB — LIPASE, BLOOD: Lipase: 22 U/L (ref 11–51)

## 2017-05-09 NOTE — ED Triage Notes (Signed)
Pt comes into the ED via POV c/o right lower abdominal pain that started at 13:00 today.  Patient has had intermittent nausea accompanied with the pain.  Denies any lower back pain, diarrhea, or chest pain.  H/o ovarian cysts in the past.  Patient ambulatory to triage at this time and has even and unlabored respirations.

## 2017-05-10 ENCOUNTER — Emergency Department: Payer: Medicaid Other

## 2017-05-10 ENCOUNTER — Emergency Department
Admission: EM | Admit: 2017-05-10 | Discharge: 2017-05-10 | Disposition: A | Discharge status: Home / Self Care | Payer: Medicaid Other | Attending: Emergency Medicine | Admitting: Emergency Medicine

## 2017-05-10 DIAGNOSIS — N83201 Unspecified ovarian cyst, right side: Secondary | ICD-10-CM

## 2017-05-10 DIAGNOSIS — R1031 Right lower quadrant pain: Secondary | ICD-10-CM

## 2017-05-10 LAB — URINALYSIS, COMPLETE (UACMP) WITH MICROSCOPIC
Bacteria, UA: NONE SEEN
Bilirubin Urine: NEGATIVE
GLUCOSE, UA: NEGATIVE mg/dL
HGB URINE DIPSTICK: NEGATIVE
Ketones, ur: NEGATIVE mg/dL
NITRITE: NEGATIVE
PROTEIN: 30 mg/dL — AB
SPECIFIC GRAVITY, URINE: 1.026 (ref 1.005–1.030)
pH: 6 (ref 5.0–8.0)

## 2017-05-10 MED ORDER — KETOROLAC TROMETHAMINE 30 MG/ML IJ SOLN
30.0000 mg | Freq: Once | INTRAMUSCULAR | Status: AC
  Administered 2017-05-10: 30 mg via INTRAVENOUS
  Filled 2017-05-10: qty 1

## 2017-05-10 MED ORDER — ONDANSETRON HCL 4 MG/2ML IJ SOLN
4.0000 mg | Freq: Once | INTRAMUSCULAR | Status: AC
  Administered 2017-05-10: 4 mg via INTRAVENOUS
  Filled 2017-05-10: qty 2

## 2017-05-10 MED ORDER — KETOROLAC TROMETHAMINE 10 MG PO TABS: 10.0000 mg | ORAL_TABLET | Freq: Four times a day (QID) | ORAL | 0 refills | Status: DC | PRN

## 2017-05-10 NOTE — ED Provider Notes (Signed)
[facility] Emergency Department Provider Note    First MD Initiated Contact with Patient 05/10/17 [phone]     (approximate)  I have reviewed the triage vital signs and the nursing notes.   HISTORY  Chief Complaint Abdominal Pain    HPI LADY Haley Osborne is a 26 y.o. female with history of ovarian cyst in the past presents to the emergency department with right pelvic pain which began approximately 1:00 PM yesterday.  Patient admits to intermittent nausea however no vomiting.  Patient denies any fever no diarrhea or constipation.  Patient denies any urinary symptoms.   Past Medical History:  Diagnosis Date  . Ovarian cyst     There are no active problems to display for this patient.   Past Surgical History:  Procedure Laterality Date  . BREAST SURGERY    . miscarriages      Prior to Admission medications   Medication Sig Start Date End Date Taking? Authorizing Provider  amoxicillin-clavulanate (AUGMENTIN) 875-125 MG tablet Take 1 tablet by mouth 2 (two) times daily. 11/12/16   Triplett, Rulon Eisenmenger B, FNP  ketorolac (TORADOL) 10 MG tablet Take 1 tablet (10 mg total) by mouth every 6 (six) hours as needed. 05/10/17   Darci Current, MD  magic mouthwash w/lidocaine SOLN Take 10 mLs by mouth 4 (four) times daily as needed for mouth pain. 11/12/16   Triplett, Rulon Eisenmenger B, FNP  meloxicam (MOBIC) 15 MG tablet Take 1 tablet (15 mg total) by mouth daily. 03/19/17   Cuthriell, Delorise Royals, PA-C  Prenatal Vit-Fe Fumarate-FA (GNP PRENATAL VITAMINS) 28-0.8 MG TABS Take 1 tablet by mouth daily. 03/05/15   Jeanmarie Plant, MD    Allergies Shrimp [shellfish allergy]  No family history on file.  Social History Social History   Tobacco Use  . Smoking status: Current Every Day Smoker    Packs/day: 1.00    Types: Cigarettes  . Smokeless tobacco: Never Used  Substance Use Topics  . Alcohol use: No  . Drug use: No    Review of Systems Constitutional: No  fever/chills Eyes: No visual changes. ENT: No sore throat. Cardiovascular: Denies chest pain. Respiratory: Denies shortness of breath. Gastrointestinal: Positive for right pelvic pain and nausea, no vomiting.  No diarrhea.  No constipation. Genitourinary: Negative for dysuria. Musculoskeletal: Negative for neck pain.  Negative for back pain. Integumentary: Negative for rash. Neurological: Negative for headaches, focal weakness or numbness.   ____________________________________________   PHYSICAL EXAM:  VITAL SIGNS: ED Triage Vitals  Enc Vitals Group     BP 05/09/17 2143 (!) 130/94     Pulse Rate 05/09/17 2143 100     Resp 05/09/17 2143 18     Temp 05/09/17 2143 98.8 F (37.1 C)     Temp Source 05/09/17 2143 Oral     SpO2 05/09/17 2143 99 %     Weight 05/09/17 2143 61.7 kg (136 lb)     Height 05/09/17 2143 1.499 m (4\' 11" )     Head Circumference --      Peak Flow --      Pain Score 05/09/17 2142 9     Pain Loc --      Pain Edu? --      Excl. in GC? --     Constitutional: Alert and oriented. Well appearing and in no acute distress. Eyes: Conjunctivae are normal.  Head: Atraumatic. Mouth/Throat: Mucous membranes are moist.  Oropharynx non-erythematous. Neck: No stridor.  Cardiovascular: Normal rate, regular rhythm. Good  peripheral circulation. Grossly normal heart sounds. Respiratory: Normal respiratory effort.  No retractions. Lungs CTAB. Gastrointestinal: Soft and nontender. No distention.  Musculoskeletal: No lower extremity tenderness nor edema. No gross deformities of extremities. Neurologic:  Normal speech and language. No gross focal neurologic deficits are appreciated.  Skin:  Skin is warm, dry and intact. No rash noted. Psychiatric: Mood and affect are normal. Speech and behavior are normal.  ____________________________________________   LABS (all labs ordered are listed, but only abnormal results are displayed)  Labs Reviewed  COMPREHENSIVE METABOLIC  PANEL - Abnormal; Notable for the following components:      Result Value   Potassium 3.4 (*)    ALT 13 (*)    All other components within normal limits  URINALYSIS, COMPLETE (UACMP) WITH MICROSCOPIC - Abnormal; Notable for the following components:   Color, Urine YELLOW (*)    APPearance HAZY (*)    Protein, ur 30 (*)    Leukocytes, UA SMALL (*)    Squamous Epithelial / LPF 6-30 (*)    All other components within normal limits  LIPASE, BLOOD  CBC  POC URINE PREG, ED  POCT PREGNANCY, URINE   ___  RADIOLOGY I, [place] N Nalla Purdy, personally viewed and evaluated these images (plain radiographs) as part of my medical decision making, as well as reviewing the written report by the radiologist.  [place] Pelvis Transvanginal Non-ob (tv Only)  Result Date: 05/10/2017 CLINICAL DATA:  Initial evaluation for acute right-sided pelvic pain. EXAM: TRANSABDOMINAL AND TRANSVAGINAL ULTRASOUND OF PELVIS TECHNIQUE: Both transabdominal and transvaginal ultrasound examinations of the pelvis were performed. Transabdominal technique was performed for global imaging of the pelvis including uterus, ovaries, adnexal regions, and pelvic cul-de-sac. It was necessary to proceed with endovaginal exam following the transabdominal exam to visualize the uterus and ovaries. COMPARISON:  None available. FINDINGS: Uterus Measurements: 8.5 x 4.2 x 5.9 cm. No fibroids or other mass visualized. Endometrium Thickness: 12.5 mm.  No focal abnormality visualized. Right ovary Measurements: 3.7 x 2.6 x 83.3 cm. Normal appearance/no adnexal mass. 2.1 x 2.0 x 1.8 cm simple anechoic cyst, most consistent with a normal physiologic cyst/dominant follicle. Left ovary Measurements: 3.9 x 2.2 x 1.9 cm. Normal appearance/no adnexal mass. Other findings Trace free fluid within the pelvis, most likely physiologic. IMPRESSION: 1. 2.1 cm simple right ovarian physiologic cyst/dominant follicle with trace free physiologic fluid within the pelvis. 2.  Otherwise unremarkable pelvic ultrasound. Electronically Signed   By: Rise MuBenjamin  McClintock M.D.   On: 05/10/2017 03:58   [place] Pelvis Complete  Result Date: 05/10/2017 CLINICAL DATA:  Initial evaluation for acute right-sided pelvic pain. EXAM: TRANSABDOMINAL AND TRANSVAGINAL ULTRASOUND OF PELVIS TECHNIQUE: Both transabdominal and transvaginal ultrasound examinations of the pelvis were performed. Transabdominal technique was performed for global imaging of the pelvis including uterus, ovaries, adnexal regions, and pelvic cul-de-sac. It was necessary to proceed with endovaginal exam following the transabdominal exam to visualize the uterus and ovaries. COMPARISON:  None available. FINDINGS: Uterus Measurements: 8.5 x 4.2 x 5.9 cm. No fibroids or other mass visualized. Endometrium Thickness: 12.5 mm.  No focal abnormality visualized. Right ovary Measurements: 3.7 x 2.6 x 83.3 cm. Normal appearance/no adnexal mass. 2.1 x 2.0 x 1.8 cm simple anechoic cyst, most consistent with a normal physiologic cyst/dominant follicle. Left ovary Measurements: 3.9 x 2.2 x 1.9 cm. Normal appearance/no adnexal mass. Other findings Trace free fluid within the pelvis, most likely physiologic. IMPRESSION: 1. 2.1 cm simple right ovarian physiologic cyst/dominant follicle with trace free physiologic fluid  within the pelvis. 2. Otherwise unremarkable pelvic ultrasound. Electronically Signed   By: Rise MuBenjamin  McClintock M.D.   On: 05/10/2017 03:58     Procedures   ____________________________________________   INITIAL IMPRESSION / ASSESSMENT AND PLAN / ED COURSE  As part of my medical decision making, I reviewed the following data within the electronic MEDICAL RECORD 89 year old female presented with right pelvic pain laboratory data unremarkable ultrasound revealed a 2.1 cm right ovarian cysts.  On reevaluation patient's pain resolved consider the possibility of appendicitis however given no pain with right lower quadrant  tenderness suspect ovarian cyst as the etiology for the patient's pain. ____________________________________________  FINAL CLINICAL IMPRESSION(S) / ED DIAGNOSES  Final diagnoses:  Cyst of right ovary     MEDICATIONS GIVEN DURING THIS VISIT:  Medications  ketorolac (TORADOL) 30 MG/ML injection 30 mg (30 mg Intravenous Given 05/10/17 0122)  ondansetron (ZOFRAN) injection 4 mg (4 mg Intravenous Given 05/10/17 0122)     ED Discharge Orders        Ordered    ketorolac (TORADOL) 10 MG tablet  Every 6 hours PRN     05/10/17 0413       Note:  This document was prepared using Dragon voice recognition software and may include unintentional dictation errors.    Darci CurrentBrown, [place] N, MD 05/10/17 [phone]

## 2017-05-10 NOTE — ED Notes (Addendum)
Pt states having sharp pain in right abdominal area since 1:00pm yesterday. She is also has some nausea. Pt has Hx of ovarian cyst. Dr. Manson PasseyBrown at bedside.

## 2017-08-07 ENCOUNTER — Other Ambulatory Visit: Payer: Self-pay | Admitting: Family Medicine

## 2017-08-07 DIAGNOSIS — R102 Pelvic and perineal pain: Secondary | ICD-10-CM

## 2017-08-14 ENCOUNTER — Ambulatory Visit: Payer: Medicaid Other

## 2017-08-17 ENCOUNTER — Ambulatory Visit
Admission: RE | Admit: 2017-08-17 | Discharge: 2017-08-17 | Disposition: A | Discharge status: Home / Self Care | Payer: Medicaid Other | Source: Ambulatory Visit | Attending: Family Medicine | Admitting: Family Medicine

## 2017-08-17 DIAGNOSIS — R102 Pelvic and perineal pain: Secondary | ICD-10-CM

## 2017-08-20 ENCOUNTER — Ambulatory Visit: Payer: Medicaid Other

## 2020-12-08 ENCOUNTER — Other Ambulatory Visit: Payer: Self-pay

## 2020-12-08 ENCOUNTER — Ambulatory Visit (LOCAL_COMMUNITY_HEALTH_CENTER): Payer: Medicaid Other

## 2020-12-08 DIAGNOSIS — Z23 Encounter for immunization: Secondary | ICD-10-CM

## 2020-12-08 NOTE — Progress Notes (Signed)
Patient presents to nurse clinic for immunizations. Pt states she needs Hep A and Varicella vaccines for new employment. Reviewed [facility] and Epic and found no records of 2nd Hep A or varicella immunizations. Patient states she recalls having varicella vaccines and hx of chicken box as a child. Counseled patient on varicella titer based on standing order. Patient voiced understanding will schedule titer lab work if unable to locate records. 2nd dose Hep A given and patient tolerated well. [facility] updated and copy given.

## 2021-03-02 ENCOUNTER — Encounter: Payer: Self-pay | Admitting: Nurse Practitioner

## 2021-03-02 NOTE — Progress Notes (Signed)
Patient presented to clinic on 11/26/2020 for a Hep A vaccine.  At that time patient received a pediatric dose of Hep A.  Occurrence discovered through chart audit on 12/21/2020.  Supervisor notified patient and advised that Hep A vaccine would need to be repeated.  Immunization coordinator called patient to schedule appointment through multiple attempts, unable to reach patient.  No return phone call. Glenna Fellows, RN

## 2021-03-02 NOTE — Progress Notes (Signed)
Telephone call to patient today.  Patient agreed to come in on Friday 03/04/2021 to receive Hepatitis A vaccine. Glenna Fellows, RN

## 2021-03-04 ENCOUNTER — Ambulatory Visit: Payer: Medicaid Other

## 2021-04-02 ENCOUNTER — Other Ambulatory Visit: Payer: Self-pay

## 2021-04-02 ENCOUNTER — Encounter: Payer: Self-pay | Admitting: Emergency Medicine

## 2021-04-02 ENCOUNTER — Emergency Department
Admission: EM | Admit: 2021-04-02 | Discharge: 2021-04-02 | Disposition: A | Discharge status: Home / Self Care | Payer: Medicaid Other | Attending: Student | Admitting: Student

## 2021-04-02 DIAGNOSIS — Z20822 Contact with and (suspected) exposure to covid-19: Secondary | ICD-10-CM | POA: Insufficient documentation

## 2021-04-02 DIAGNOSIS — J069 Acute upper respiratory infection, unspecified: Secondary | ICD-10-CM | POA: Diagnosis not present

## 2021-04-02 DIAGNOSIS — F17210 Nicotine dependence, cigarettes, uncomplicated: Secondary | ICD-10-CM | POA: Diagnosis not present

## 2021-04-02 DIAGNOSIS — R059 Cough, unspecified: Secondary | ICD-10-CM | POA: Diagnosis present

## 2021-04-02 LAB — RESP PANEL BY RT-PCR (FLU A&B, COVID) ARPGX2
Influenza A by PCR: NEGATIVE
Influenza B by PCR: NEGATIVE
SARS Coronavirus 2 by RT PCR: NEGATIVE

## 2021-04-02 NOTE — ED Notes (Signed)
Patient stable and discharged with all personal belongings and AVS. AVS and discharge instructions reviewed with patient and opportunity for questions provided.   

## 2021-04-02 NOTE — ED Provider Notes (Signed)
[facility] Emergency Department Provider Note   ____________________________________________   Event Date/Time   First MD Initiated Contact with Patient 04/02/21 1640     (approximate)  I have reviewed the triage vital signs and the nursing notes.   HISTORY  Chief Complaint Fever, Cough, and Nasal Congestion    HPI Haley Osborne is a 30 y.o. female who presents for cough  LOCATION: Chest DURATION: 3 days prior to arrival TIMING: Worsening since onset SEVERITY: Mild QUALITY: Cough, fever, runny nose CONTEXT: States that her 63-year-old son who also presents today has been sick with similar symptoms for the past week and patient developed symptoms 3 days prior to arrival MODIFYING FACTORS: Endorses mild improvement with Tylenol and denies any exacerbating symptoms ASSOCIATED SYMPTOMS: Fever, runny nose   Per medical record review, past medical history noncontributory          Past Medical History:  Diagnosis Date   Ovarian cyst     There are no problems to display for this patient.   Past Surgical History:  Procedure Laterality Date   BREAST SURGERY     miscarriages      Prior to Admission medications   Medication Sig Start Date End Date Taking? Authorizing Provider  amoxicillin-clavulanate (AUGMENTIN) 875-125 MG tablet Take 1 tablet by mouth 2 (two) times daily. Patient not taking: Reported on 12/08/2020 11/12/16   Kem Boroughs B, FNP  ketorolac (TORADOL) 10 MG tablet Take 1 tablet (10 mg total) by mouth every 6 (six) hours as needed. Patient not taking: Reported on 12/08/2020 05/10/17   Darci Current, MD  magic mouthwash w/lidocaine SOLN Take 10 mLs by mouth 4 (four) times daily as needed for mouth pain. Patient not taking: Reported on 12/08/2020 11/12/16   Kem Boroughs B, FNP  meloxicam (MOBIC) 15 MG tablet Take 1 tablet (15 mg total) by mouth daily. Patient not taking: Reported on 12/08/2020 03/19/17   Cuthriell, Delorise Royals,  PA-C  Prenatal Vit-Fe Fumarate-FA [facility] PRENATAL VITAMINS) 28-0.8 MG TABS Take 1 tablet by mouth daily. Patient not taking: Reported on 12/08/2020 03/05/15   Jeanmarie Plant, MD    Allergies Shrimp [shellfish allergy]  History reviewed. No pertinent family history.  Social History Social History   Tobacco Use   Smoking status: Every Day    Packs/day: 1.00    Types: Cigarettes   Smokeless tobacco: Never  Substance Use Topics   Alcohol use: No   Drug use: No    Review of Systems Constitutional: Endorses fever/chills Eyes: No visual changes. ENT: Endorses sore throat, runny nose, and cough Cardiovascular: Denies chest pain. Respiratory: Denies shortness of breath. Gastrointestinal: No abdominal pain.  No nausea, no vomiting.  No diarrhea. Genitourinary: Negative for dysuria. Musculoskeletal: Negative for acute arthralgias Skin: Negative for rash. Neurological: Negative for headaches, weakness/numbness/paresthesias in any extremity Psychiatric: Negative for suicidal ideation/homicidal ideation   ____________________________________________   PHYSICAL EXAM:  VITAL SIGNS: ED Triage Vitals  Enc Vitals Group     BP 04/02/21 1621 105/76     Pulse Rate 04/02/21 1621 90     Resp 04/02/21 1621 20     Temp 04/02/21 1621 98.6 F (37 C)     Temp Source 04/02/21 1621 Oral     SpO2 04/02/21 1621 93 %     Weight 04/02/21 1623 147 lb (66.7 kg)     Height 04/02/21 1623 4\' 11"  (1.499 m)     Head Circumference --      Peak  Flow --      Pain Score 04/02/21 1623 0     Pain Loc --      Pain Edu? --      Excl. in GC? --    Constitutional: Alert and oriented. Well appearing and in no acute distress. Eyes: Conjunctivae are injected. PERRL. Head: Atraumatic. Nose: No congestion/rhinnorhea. Mouth/Throat: Mucous membranes are moist.  Erythematous posterior oropharynx Neck: No stridor Cardiovascular: Grossly normal heart sounds.  Good peripheral circulation. Respiratory: Normal  respiratory effort.  No retractions.  Clear to auscultation bilaterally Gastrointestinal: Soft and nontender. No distention. Musculoskeletal: No obvious deformities Neurologic:  Normal speech and language. No gross focal neurologic deficits are appreciated. Skin:  Skin is warm and dry. No rash noted. Psychiatric: Mood and affect are normal. Speech and behavior are normal.  ____________________________________________   LABS (all labs ordered are listed, but only abnormal results are displayed)  Labs Reviewed  RESP PANEL BY RT-PCR (FLU A&B, COVID) [id number]   PROCEDURES  Procedure(s) performed (including Critical Care):  Procedures   ____________________________________________   INITIAL IMPRESSION / ASSESSMENT AND PLAN / ED COURSE  As part of my medical decision making, I reviewed the following data within the electronic medical record, if available:  Nursing notes reviewed and incorporated, Labs reviewed, EKG interpreted, Old chart reviewed, Radiograph reviewed and Notes from prior ED visits reviewed and incorporated        Insert URI discharge otherwise healthy patient presenting with constellation of symptoms likely representing uncomplicated viral upper respiratory symptoms as characterized by mild pharyngitis  Unlikely PTA/RPA: no hot potato voice, no uvular deviation, Unlikely Esophageal rupture: No history of dysphagia Unlikely deep space infection/Ludwigs Low suspicion for CNS infection bacterial sinusitis, or pneumonia given exam and history.  Unlikely Strep or EBV as centor negative and with no pharyngeal exudate, posterior LAD, or splenomegaly. Given son that is positive for RSV, likely RSV in her as well however testing is negative today Will attempt to alleviate symptoms conservatively; no overt indications at this time for antibiotics. No respiratory distress, otherwise relatively well appearing and nontoxic. Will discuss prompt follow up with PMD and strict  return precautions.  Dispo: Discharge home with home care and PCP follow-up if symptoms worsen      ____________________________________________   FINAL CLINICAL IMPRESSION(S) / ED DIAGNOSES  Final diagnoses:  Viral URI with cough     ED Discharge Orders     None        Note:  This document was prepared using Dragon voice recognition software and may include unintentional dictation errors.    Merwyn Katos, MD 04/02/21 [phone]

## 2021-04-02 NOTE — ED Triage Notes (Signed)
Pt via POV from home. Pt is having fever, cough, and nasal congestion for the past 2 weeks. Son has been sick also. Denies hx of COPD, CHF, or asthma. Pt is A&Ox4 and NAD.

## 2021-04-02 NOTE — ED Provider Notes (Signed)
Emergency Medicine Provider Triage Evaluation Note  Haley Osborne , a 30 y.o. female  was evaluated in triage.  Pt complains of cough, runny nose, body aches and intermittent fever .  Review of Systems  Positive: Cough, runny nose, body aches Negative: Fever, N/V/D, abd/back pain, SHOB, CP   Physical Exam  BP 105/76 (BP Location: Right Arm)   Pulse 90   Temp 98.6 F (37 C) (Oral)   Resp 20   Ht 4\' 11"  (1.499 m)   Wt 66.7 kg   SpO2 93%   BMI 29.69 kg/m  Gen:   Awake, no distress   Resp:  Normal effort  MSK:   Moves extremities without difficulty body aches Other:    Medical Decision Making  Medically screening exam initiated at 4:24 PM.  Appropriate orders placed.  Haley Osborne was informed that the remainder of the evaluation will be completed by another provider, this initial triage assessment does not replace that evaluation, and the importance of remaining in the ED until their evaluation is complete.  Will covid test    Erin Sons, NP 04/02/21 1627    13/05/22, MD 04/02/21 [phone]

## 2021-09-27 ENCOUNTER — Emergency Department
Admission: EM | Admit: 2021-09-27 | Discharge: 2021-09-27 | Disposition: A | Discharge status: Home / Self Care | Payer: Medicaid Other | Attending: Emergency Medicine | Admitting: Emergency Medicine

## 2021-09-27 ENCOUNTER — Encounter: Payer: Self-pay | Admitting: Emergency Medicine

## 2021-09-27 ENCOUNTER — Other Ambulatory Visit: Payer: Self-pay

## 2021-09-27 ENCOUNTER — Emergency Department: Payer: Medicaid Other

## 2021-09-27 DIAGNOSIS — Y92410 Unspecified street and highway as the place of occurrence of the external cause: Secondary | ICD-10-CM | POA: Insufficient documentation

## 2021-09-27 DIAGNOSIS — M79601 Pain in right arm: Secondary | ICD-10-CM | POA: Diagnosis not present

## 2021-09-27 DIAGNOSIS — M25511 Pain in right shoulder: Secondary | ICD-10-CM | POA: Diagnosis not present

## 2021-09-27 DIAGNOSIS — M542 Cervicalgia: Secondary | ICD-10-CM | POA: Diagnosis not present

## 2021-09-27 LAB — POC URINE PREG, ED: Preg Test, Ur: NEGATIVE

## 2021-09-27 MED ORDER — IBUPROFEN 400 MG PO TABS
400.0000 mg | ORAL_TABLET | Freq: Once | ORAL | Status: AC
Start: 2021-09-27 — End: 2021-09-27
  Administered 2021-09-27: 400 mg via ORAL

## 2021-09-27 MED ORDER — LIDOCAINE 5 % EX PTCH
1.0000 | MEDICATED_PATCH | CUTANEOUS | Status: DC
  Administered 2021-09-27: 1 via TRANSDERMAL
  Filled 2021-09-27: qty 1

## 2021-09-27 MED ORDER — CYCLOBENZAPRINE HCL 10 MG PO TABS: 10.0000 mg | ORAL_TABLET | Freq: Three times a day (TID) | ORAL | 0 refills | Status: AC | PRN

## 2021-09-27 MED ORDER — ACETAMINOPHEN 500 MG PO TABS
1000.0000 mg | ORAL_TABLET | Freq: Once | ORAL | Status: AC
  Administered 2021-09-27: 1000 mg via ORAL
  Filled 2021-09-27: qty 2
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
## 2022-08-19 ENCOUNTER — Other Ambulatory Visit: Payer: Self-pay

## 2022-08-19 ENCOUNTER — Emergency Department
Admission: EM | Admit: 2022-08-19 | Discharge: 2022-08-19 | Disposition: A | Discharge status: Home / Self Care | Payer: Medicaid Other | Attending: Emergency Medicine | Admitting: Emergency Medicine

## 2022-08-19 ENCOUNTER — Emergency Department: Payer: Medicaid Other

## 2022-08-19 DIAGNOSIS — F17210 Nicotine dependence, cigarettes, uncomplicated: Secondary | ICD-10-CM | POA: Insufficient documentation

## 2022-08-19 DIAGNOSIS — Z3A01 Less than 8 weeks gestation of pregnancy: Secondary | ICD-10-CM | POA: Diagnosis not present

## 2022-08-19 DIAGNOSIS — R110 Nausea: Secondary | ICD-10-CM | POA: Insufficient documentation

## 2022-08-19 DIAGNOSIS — O209 Hemorrhage in early pregnancy, unspecified: Secondary | ICD-10-CM

## 2022-08-19 DIAGNOSIS — R42 Dizziness and giddiness: Secondary | ICD-10-CM | POA: Insufficient documentation

## 2022-08-19 DIAGNOSIS — O208 Other hemorrhage in early pregnancy: Secondary | ICD-10-CM | POA: Insufficient documentation

## 2022-08-19 LAB — URINALYSIS, ROUTINE W REFLEX MICROSCOPIC
Bilirubin Urine: NEGATIVE
Glucose, UA: NEGATIVE mg/dL
Hgb urine dipstick: NEGATIVE
Ketones, ur: NEGATIVE mg/dL
Nitrite: NEGATIVE
Protein, ur: 30 mg/dL — AB
Specific Gravity, Urine: 1.027 (ref 1.005–1.030)
pH: 5 (ref 5.0–8.0)

## 2022-08-19 LAB — CBC
HCT: 34.7 % — ABNORMAL LOW (ref 36.0–46.0)
Hemoglobin: 11.2 g/dL — ABNORMAL LOW (ref 12.0–15.0)
MCH: 29.9 pg (ref 26.0–34.0)
MCHC: 32.3 g/dL (ref 30.0–36.0)
MCV: 92.5 fL (ref 80.0–100.0)
Platelets: 300 10*3/uL (ref 150–400)
RBC: 3.75 MIL/uL — ABNORMAL LOW (ref 3.87–5.11)
RDW: 13.2 % (ref 11.5–15.5)
WBC: 7.3 10*3/uL (ref 4.0–10.5)
nRBC: 0 % (ref 0.0–0.2)

## 2022-08-19 LAB — POC URINE PREG, ED: Preg Test, Ur: POSITIVE — AB

## 2022-08-19 LAB — HCG, QUANTITATIVE, PREGNANCY: hCG, Beta Chain, Quant, S: 10342 m[IU]/mL — ABNORMAL HIGH (ref <5)

## 2022-08-19 LAB — ABO/RH: ABO/RH(D): O POS

## 2022-08-19 NOTE — ED Triage Notes (Signed)
Pt c/o spotting/bleeding x4 days and reports taking taking two positive and one negative pregnancy test. Pt c/o pelvic cramping. Bleeding is mild red spotting without clots.

## 2022-08-19 NOTE — Discharge Instructions (Signed)
You have been seen today in the emergency room for spotting during early pregnancy.  Your ultrasound results were reassuring.  I recommend that you follow-up with your OB/GYN provider Monday to schedule appointment for new OB.  If your bleeding becomes heavier or you start to have abdominal pain/cramping you should return to the emergency room promptly.

## 2022-08-19 NOTE — ED Notes (Signed)
Report given to Rachel RN

## 2022-08-19 NOTE — ED Provider Notes (Cosign Needed Addendum)
[facility] Emergency Department Provider Note   ____________________________________________   Event Date/Time   First MD Initiated Contact with Patient 08/19/22 1855     (approximate)  I have reviewed the triage vital signs and the nursing notes.   HISTORY  Chief Complaint Vaginal Bleeding    HPI Haley Osborne is a 32 y.o. female patient reports that Tuesday she started with vaginal spotting, dizziness, nausea.  She reports that she had missed her menses.  She reported that over the next several days she continued with pink-tinged vaginal spotting.  However, yesterday she started with bright red vaginal spotting.  She reports that she is only using 1 pad in a 24-hour period.  Patient reports that today she took 3 home pregnancy test with one of them being negative and the other 2 being positive.  She is here today questioning if she has a "hormonal imbalance" that is causing her to spot.  She also complains of lower abdominal pain that radiates into the right side.  Patient does have history of ectopic pregnancy that required surgical intervention.  At this time, she is in no pain. She has not taken anything for her discomfort at this time.  Dizziness and nausea have resolved.   Past Medical History:  Diagnosis Date   Ovarian cyst     There are no problems to display for this patient.   Past Surgical History:  Procedure Laterality Date   BREAST SURGERY     miscarriages      Prior to Admission medications   Not on File    Allergies Shrimp [shellfish allergy]  History reviewed. No pertinent family history.  Social History Social History   Tobacco Use   Smoking status: Every Day    Packs/day: 1    Types: Cigarettes   Smokeless tobacco: Never  Vaping Use   Vaping Use: Never used  Substance Use Topics   Alcohol use: No   Drug use: No    Review of Systems  Constitutional: No fever/chills Eyes: No visual changes. ENT: No sore  throat. Cardiovascular: Denies chest pain. Respiratory: Denies shortness of breath. Gastrointestinal: Positive abdominal pain no nausea, no vomiting.  No diarrhea.  No constipation. Genitourinary: Negative for dysuria. Musculoskeletal: Negative for back pain. Skin: Negative for rash. Neurological: Negative for headaches, focal weakness or numbness.   ____________________________________________   PHYSICAL EXAM:  VITAL SIGNS: ED Triage Vitals  Enc Vitals Group     BP 08/19/22 1842 118/72     Pulse Rate 08/19/22 1846 91     Resp 08/19/22 1842 16     Temp 08/19/22 1842 98.2 F (36.8 C)     Temp Source 08/19/22 1842 Oral     SpO2 08/19/22 1842 99 %     Weight --      Height --      Head Circumference --      Peak Flow --      Pain Score 08/19/22 1844 6     Pain Loc --      Pain Edu? --      Excl. in [place]? --     Constitutional: Alert and oriented. Well appearing and in no acute distress. Eyes: Conjunctivae are normal. PERRL. EOMI. Head: Atraumatic. Nose: No congestion/rhinnorhea. Mouth/Throat: Mucous membranes are moist.  Oropharynx non-erythematous. Neck: No stridor.   Cardiovascular: Normal rate, regular rhythm. Grossly normal heart sounds.  Good peripheral circulation. Respiratory: Normal respiratory effort.  No retractions. Lungs CTAB. Gastrointestinal: Soft and  nontender. No distention. No abdominal bruits. No CVA tenderness. Musculoskeletal: No lower extremity tenderness nor edema.  No joint effusions. Neurologic:  Normal speech and language. No gross focal neurologic deficits are appreciated. No gait instability. Skin:  Skin is warm, dry and intact. No rash noted. Psychiatric: Mood and affect are normal. Speech and behavior are normal.  ____________________________________________   LABS (all labs ordered are listed, but only abnormal results are displayed)  Labs Reviewed  CBC - Abnormal; Notable for the following components:      Result Value   RBC 3.75 (*)     Hemoglobin 11.2 (*)    HCT 34.7 (*)    All other components within normal limits  HCG, QUANTITATIVE, PREGNANCY - Abnormal; Notable for the following components:   hCG, Beta Chain, Quant, S 10,342 (*)    All other components within normal limits  URINALYSIS, ROUTINE W REFLEX MICROSCOPIC - Abnormal; Notable for the following components:   Color, Urine YELLOW (*)    APPearance HAZY (*)    Protein, ur 30 (*)    Leukocytes,Ua MODERATE (*)    Bacteria, UA RARE (*)    All other components within normal limits  POC URINE PREG, ED - Abnormal; Notable for the following components:   Preg Test, Ur POSITIVE (*)    All other components within normal limits  ABO/RH   ____________________________________________  EKG   ____________________________________________  RADIOLOGY  ED MD interpretation: Ultrasound results reviewed by me and read by radiologist.  Official radiology report(s): US OB LESS THAN 14 WEEKS WITH OB TRANSVAGINAL  Result Date: 08/19/2022 CLINICAL DATA:  [id number] Vaginal bleeding [id number] EXAM: OBSTETRIC <14 WK [place] AND TRANSVAGINAL OB US TECHNIQUE: Ob ultrasound was performed for complete evaluation of the gestation as well as the maternal uterus, adnexal regions, and pelvic cul-de-sac. COMPARISON:  None Available. FINDINGS: Intrauterine gestational sac: Single Yolk sac:  Visualized. Embryo:  Not identified MSD: 0.9 cm = 5 weeks 5 days [place] EDC: 04/16/2023 Subchorionic hemorrhage:  None visualized. Adnexa: No masses or fluid collections. 1.9 cm corpus luteum on the left. IMPRESSION: 5 week 5 day intrauterine gestational sac with a yolk sac. No subchorionic hemorrhage or adnexal pathology. Electronically Signed   By: Sammie Bench M.D.   On: 08/19/2022 20:12    ____________________________________________   PROCEDURES  Procedure(s) performed: None  Procedures  Critical Care performed: No  ____________________________________________   INITIAL IMPRESSION / ASSESSMENT AND  PLAN / ED COURSE     Haley Osborne is a 32 y.o. female patient reports that Tuesday she started with vaginal spotting, dizziness, nausea.  She reports that she had missed her menses.  She reported that over the next several days she continued with pink-tinged vaginal spotting.  However, yesterday she started with bright red vaginal spotting.  She reports that she is only using 1 pad in a 24-hour period.  Patient reports that today she took 3 home pregnancy test with one of them being negative and the other 2 being positive.  She is here today questioning if she has a "hormonal imbalance" that is causing her to spot.  She also complains of lower abdominal pain that radiates into the right side.  Patient does have history of ectopic pregnancy that required surgical intervention.  At this time, she is in no pain. She has not taken anything for her discomfort at this time.  Dizziness and nausea have resolved.  Urine Prag in ED is positive.  Will get pregnancy quant.  Have also  ordered ultrasound.  CBC and urinalysis both unremarkable.  Ultrasound results show that there is 1 intrauterine pregnancy at 5 weeks 5 days with gestational sac and yolk sac.  No hemorrhage or adnexal pathology was noted. Based on reassuring ultrasound, patient will be discharged home in stable condition. I discussed with her that she will need to contact her OB provider to schedule appointment.  She reports that she will use the OB provider she used for her other children.  Therefore, no information for referral was provided today.     ____________________________________________   FINAL CLINICAL IMPRESSION(S) / ED DIAGNOSES  Final diagnoses:  Bleeding in early pregnancy  Less than [redacted] weeks gestation of pregnancy     ED Discharge Orders     None        Note:  This document was prepared using Dragon voice recognition software and may include unintentional dictation errors.     Willaim Rayas, NP 08/19/22  2043    Willaim Rayas, NP 08/19/22 2109    Lavonia Drafts, MD 08/21/22 [phone]

## 2022-11-30 ENCOUNTER — Other Ambulatory Visit: Payer: Self-pay

## 2022-11-30 ENCOUNTER — Emergency Department
Admission: EM | Admit: 2022-11-30 | Discharge: 2022-11-30 | Discharge status: Against Medical Advice | Payer: Medicaid Other | Attending: Emergency Medicine | Admitting: Emergency Medicine

## 2022-11-30 DIAGNOSIS — B349 Viral infection, unspecified: Secondary | ICD-10-CM | POA: Diagnosis not present

## 2022-11-30 DIAGNOSIS — Z5321 Procedure and treatment not carried out due to patient leaving prior to being seen by health care provider: Secondary | ICD-10-CM | POA: Diagnosis not present

## 2022-11-30 DIAGNOSIS — R509 Fever, unspecified: Secondary | ICD-10-CM | POA: Diagnosis present

## 2022-11-30 DIAGNOSIS — Z20822 Contact with and (suspected) exposure to covid-19: Secondary | ICD-10-CM | POA: Diagnosis not present

## 2022-11-30 LAB — RESP PANEL BY RT-PCR (RSV, FLU A&B, COVID)  RVPGX2
Influenza A by PCR: NEGATIVE
Influenza B by PCR: NEGATIVE
Resp Syncytial Virus by PCR: NEGATIVE
SARS Coronavirus 2 by RT PCR: NEGATIVE

## 2022-11-30 LAB — GROUP A STREP BY PCR: Group A Strep by PCR: NOT DETECTED

## 2022-11-30 NOTE — ED Triage Notes (Signed)
Pt presents to ER with c/o fever x4 days and has been taking tylenol and motrin.  Pt states that today, she stopped running fever, but has felt increasingly nauseated, with body aches.  Pt denies any sore throat or cough, but has had a mild amt of congestion/runny nose.  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  Pt otherwise A&O x4 and in NAD.

## 2023-01-04 ENCOUNTER — Emergency Department
Admission: EM | Admit: 2023-01-04 | Discharge: 2023-01-04 | Disposition: A | Discharge status: Home / Self Care | Payer: Medicaid Other | Attending: Student in an Organized Health Care Education/Training Program | Admitting: Student in an Organized Health Care Education/Training Program

## 2023-01-04 ENCOUNTER — Other Ambulatory Visit: Payer: Self-pay

## 2023-01-04 ENCOUNTER — Encounter: Payer: Self-pay | Admitting: Emergency Medicine

## 2023-01-04 DIAGNOSIS — K047 Periapical abscess without sinus: Secondary | ICD-10-CM | POA: Insufficient documentation

## 2023-01-04 DIAGNOSIS — K0889 Other specified disorders of teeth and supporting structures: Secondary | ICD-10-CM | POA: Diagnosis present

## 2023-01-04 LAB — COMPREHENSIVE METABOLIC PANEL
ALT: 14 U/L (ref 0–44)
AST: 18 U/L (ref 15–41)
Albumin: 4.3 g/dL (ref 3.5–5.0)
Alkaline Phosphatase: 62 U/L (ref 38–126)
Anion gap: 9 (ref 5–15)
BUN: 14 mg/dL (ref 6–20)
CO2: 26 mmol/L (ref 22–32)
Calcium: 9.2 mg/dL (ref 8.9–10.3)
Chloride: 105 mmol/L (ref 98–111)
Creatinine, Ser: 0.77 mg/dL (ref 0.44–1.00)
GFR, Estimated: >60 mL/min (ref >60)
Glucose, Bld: 80 mg/dL (ref 70–99)
Potassium: 3.6 mmol/L (ref 3.5–5.1)
Sodium: 140 mmol/L (ref 135–145)
Total Bilirubin: 1 mg/dL (ref 0.3–1.2)
Total Protein: 8.2 g/dL — ABNORMAL HIGH (ref 6.5–8.1)

## 2023-01-04 LAB — CBC
HCT: 39.3 % (ref 36.0–46.0)
Hemoglobin: 12.7 g/dL (ref 12.0–15.0)
MCH: 29.7 pg (ref 26.0–34.0)
MCHC: 32.3 g/dL (ref 30.0–36.0)
MCV: 91.8 fL (ref 80.0–100.0)
Platelets: 320 10*3/uL (ref 150–400)
RBC: 4.28 MIL/uL (ref 3.87–5.11)
RDW: 13.2 % (ref 11.5–15.5)
WBC: 5.7 10*3/uL (ref 4.0–10.5)
nRBC: 0 % (ref 0.0–0.2)

## 2023-01-04 MED ORDER — LIDOCAINE VISCOUS HCL 2 % MT SOLN
15.0000 mL | Freq: Once | OROMUCOSAL | Status: AC
  Administered 2023-01-04: 15 mL via OROMUCOSAL
  Filled 2023-01-04: qty 15

## 2023-01-04 MED ORDER — IBUPROFEN 600 MG PO TABS
600.0000 mg | ORAL_TABLET | Freq: Once | ORAL | Status: AC
  Administered 2023-01-04: 600 mg via ORAL
  Filled 2023-01-04: qty 1

## 2023-01-04 MED ORDER — LIDOCAINE HCL (PF) 1 % IJ SOLN
5.0000 mL | Freq: Once | INTRAMUSCULAR | Status: AC
  Administered 2023-01-04: 5 mL via INTRADERMAL
  Filled 2023-01-04: qty 5

## 2023-01-04 MED ORDER — AMOXICILLIN 500 MG PO CAPS
500.0000 mg | ORAL_CAPSULE | Freq: Once | ORAL | Status: AC
  Administered 2023-01-04: 500 mg via ORAL
  Filled 2023-01-04: qty 1

## 2023-01-04 MED ORDER — AMOXICILLIN 500 MG PO CAPS: 500.0000 mg | ORAL_CAPSULE | Freq: Three times a day (TID) | ORAL | 0 refills | Status: AC

## 2023-01-04 MED ORDER — OXYCODONE-ACETAMINOPHEN 5-325 MG PO TABS
1.0000 | ORAL_TABLET | Freq: Once | ORAL | Status: AC
  Administered 2023-01-04: 1 via ORAL
  Filled 2023-01-04: qty 1

## 2023-01-04 NOTE — ED Triage Notes (Signed)
Pt sts that she has been having dental pain for the last two week on the lower right. Pt sts that this AM, her throat is now hurting and feels funny when she swallows.

## 2023-01-04 NOTE — ED Provider Notes (Signed)
[facility] Provider Note    Event Date/Time   First MD Initiated Contact with Patient 01/04/23 1349     (approximate)   History   Dental Pain   HPI  Haley Osborne is a 32 y.o. female who presents to the ER for evaluation of worsening dental pain right lower front teeth for the past few days and woke up this morning with some swelling and was worried she was having allergic reaction.  Not currently on any antibiotics.  Has had carious dentition in the past requiring removal     Physical Exam   Triage Vital Signs: ED Triage Vitals [01/04/23 1253]  Encounter Vitals Group     BP 122/86     Systolic BP Percentile      Diastolic BP Percentile      Pulse Rate 87     Resp 18     Temp 99.4 F (37.4 C)     Temp Source Oral     SpO2 98 %     Weight 138 lb (62.6 kg)     Height 4\' 11"  (1.499 m)     Head Circumference      Peak Flow      Pain Score 7     Pain Loc      Pain Education      Exclude from Growth Chart     Most recent vital signs: Vitals:   01/04/23 1253  BP: 122/86  Pulse: 87  Resp: 18  Temp: 99.4 F (37.4 C)  SpO2: 98%     Constitutional: Alert  Eyes: Conjunctivae are normal.  Head: Atraumatic. Nose: No congestion/rhinnorhea. Mouth/Throat: Mucous membranes are moist.  Small grape size area of fluctuance and tenderness just inferior to the 26th tooth consistent with.  "Abscess. Neck: Painless ROM.  Cardiovascular:   Good peripheral circulation. Respiratory: Normal respiratory effort.  No retractions.  Gastrointestinal: Soft and nontender.  Musculoskeletal:  no deformity Neurologic:  MAE spontaneously. No gross focal neurologic deficits are appreciated.  Skin:  Skin is warm, dry and intact. No rash noted. Psychiatric: Mood and affect are normal. Speech and behavior are normal.    ED Results / Procedures / Treatments   Labs (all labs ordered are listed, but only abnormal results are displayed) Labs Reviewed   COMPREHENSIVE METABOLIC PANEL - Abnormal; Notable for the following components:      Result Value   Total Protein 8.2 (*)    All other components within normal limits  CBC     EKG     RADIOLOGY    PROCEDURES:  Critical Care performed:   Marland KitchenMarland KitchenIncision and Drainage  Date/Time: 01/04/2023 2:56 PM  Performed by: Willy Eddy, MD Authorized by: Willy Eddy, MD   Consent:    Consent obtained:  Verbal   Consent given by:  Patient   Risks discussed:  Bleeding, infection, incomplete drainage and pain   Alternatives discussed:  Alternative treatment, delayed treatment and observation Location:    Type:  Abscess   Location:  Mouth   Mouth location:  Alveolar process Anesthesia:    Anesthesia method:  Local infiltration and topical application   Local anesthetic:  Lidocaine 1% w/o epi Procedure type:    Complexity:  Simple Post-procedure details:    Procedure completion:  Tolerated well, no immediate complications    MEDICATIONS ORDERED IN ED: Medications  ibuprofen (ADVIL) tablet 600 mg (600 mg Oral Given 01/04/23 1434)  lidocaine (XYLOCAINE) 2 % viscous mouth solution 15 mL (  15 mLs Mouth/Throat Given 01/04/23 1435)  lidocaine (PF) (XYLOCAINE) 1 % injection 5 mL (5 mLs Intradermal Given 01/04/23 1435)  oxyCODONE-acetaminophen (PERCOCET/ROXICET) 5-325 MG per tablet 1 tablet (1 tablet Oral Given 01/04/23 1434)  amoxicillin (AMOXIL) capsule 500 mg (500 mg Oral Given 01/04/23 1434)     IMPRESSION / MDM / ASSESSMENT AND PLAN / ED COURSE  I reviewed the triage vital signs and the nursing notes.                              Differential diagnosis includes, but is not limited to, abscess, cellulitis, allergic reaction  Patient presented to the ER for evaluation of symptoms as described above.  Not septic well-appearing not consistent with Ludwig's angina or allergic reaction.  Consistent with carious dentition with developing periapical abscess.  We discussed options for  treatment including I&D at bedside.  Patient tolerated p.o. antibiotics.  I&D with some scant purulence drained from the area.  She does appear stable and appropriate for outpatient follow-up.  Discussed signs and symptoms for which she should return to the ER.       FINAL CLINICAL IMPRESSION(S) / ED DIAGNOSES   Final diagnoses:  Dental abscess     Rx / DC Orders   ED Discharge Orders          Ordered    amoxicillin (AMOXIL) 500 MG capsule  3 times daily        01/04/23 1454             Note:  This document was prepared using Dragon voice recognition software and may include unintentional dictation errors.    Willy Eddy, MD 01/04/23 [phone]

## 2023-06-16 ENCOUNTER — Emergency Department: Payer: Medicaid Other

## 2023-06-16 ENCOUNTER — Emergency Department
Admission: EM | Admit: 2023-06-16 | Discharge: 2023-06-16 | Disposition: A | Discharge status: Home / Self Care | Payer: Medicaid Other | Attending: Student in an Organized Health Care Education/Training Program | Admitting: Student in an Organized Health Care Education/Training Program

## 2023-06-16 ENCOUNTER — Encounter: Payer: Self-pay | Admitting: Emergency Medicine

## 2023-06-16 ENCOUNTER — Other Ambulatory Visit: Payer: Self-pay

## 2023-06-16 DIAGNOSIS — O209 Hemorrhage in early pregnancy, unspecified: Secondary | ICD-10-CM | POA: Diagnosis present

## 2023-06-16 DIAGNOSIS — Z3A01 Less than 8 weeks gestation of pregnancy: Secondary | ICD-10-CM | POA: Diagnosis not present

## 2023-06-16 LAB — CBC
HCT: 33.5 % — ABNORMAL LOW (ref 36.0–46.0)
Hemoglobin: 11.2 g/dL — ABNORMAL LOW (ref 12.0–15.0)
MCH: 30.1 pg (ref 26.0–34.0)
MCHC: 33.4 g/dL (ref 30.0–36.0)
MCV: 90.1 fL (ref 80.0–100.0)
Platelets: 296 10*3/uL (ref 150–400)
RBC: 3.72 MIL/uL — ABNORMAL LOW (ref 3.87–5.11)
RDW: 13.4 % (ref 11.5–15.5)
WBC: 7.4 10*3/uL (ref 4.0–10.5)
nRBC: 0 % (ref 0.0–0.2)

## 2023-06-16 LAB — POC URINE PREG, ED: Preg Test, Ur: POSITIVE — AB

## 2023-06-16 LAB — ABO/RH: ABO/RH(D): O POS

## 2023-06-16 LAB — HCG, QUANTITATIVE, PREGNANCY: hCG, Beta Chain, Quant, S: 157263 m[IU]/mL — ABNORMAL HIGH (ref <5)

## 2023-06-16 NOTE — ED Triage Notes (Signed)
Pt in via POV, reports vaginal "spotting" and pelvic cramping x approximately 1 week.  Patient reports positive pregnancy test x 2 weeks ago, no previous prenatal care at this time.  Reports last normal menstrual cycle was end of November, 2024.    Ambulatory to triage, NAD noted at this time.

## 2023-06-16 NOTE — Discharge Instructions (Signed)
Follow-up with your OB/GYN of choice.  If you remain in the [facility] area Dr. Jean Rosenthal is on-call and his office information and phone number are listed on your discharge papers.  Return to the emergency department if any severe worsening of your symptoms.

## 2023-06-16 NOTE — ED Provider Notes (Signed)
[facility] Provider Note    Event Date/Time   First MD Initiated Contact with Patient 06/16/23 [phone]     (approximate)   History   Vaginal Bleeding   HPI  Haley Osborne is a 33 y.o. female   presents to the ED with complaint of vaginal spotting along with some cramping for less than 1 week.  Patient states that she had a positive pregnancy test 2 weeks ago but has not had any prenatal care.  She is not sure if she is going to stay in this area.  Vaginal spotting has been approximately 1 pad for 24 hours.      Physical Exam   Triage Vital Signs: ED Triage Vitals  Encounter Vitals Group     BP 06/16/23 0649 107/68     Systolic BP Percentile --      Diastolic BP Percentile --      Pulse Rate 06/16/23 0649 88     Resp 06/16/23 0649 15     Temp 06/16/23 0649 98.8 F (37.1 C)     Temp Source 06/16/23 0649 Oral     SpO2 06/16/23 0649 97 %     Weight 06/16/23 0644 145 lb (65.8 kg)     Height 06/16/23 0644 4\' 11"  (1.499 m)     Head Circumference --      Peak Flow --      Pain Score 06/16/23 0644 7     Pain Loc --      Pain Education --      Exclude from Growth Chart --     Most recent vital signs: Vitals:   06/16/23 0649  BP: 107/68  Pulse: 88  Resp: 15  Temp: 98.8 F (37.1 C)  SpO2: 97%     General: Awake, no distress.  CV:  Good peripheral perfusion.  Heart rate and rate rhythm. Resp:  Normal effort.  Lungs clear bilaterally. Abd:  No distention.  Nontender, bowel sounds present x 4 quadrants. Other:     ED Results / Procedures / Treatments   Labs (all labs ordered are listed, but only abnormal results are displayed) Labs Reviewed  HCG, QUANTITATIVE, PREGNANCY - Abnormal; Notable for the following components:      Result Value   hCG, Beta Chain, Quant, S [id number] (*)    All other components within normal limits  CBC - Abnormal; Notable for the following components:   RBC 3.72 (*)    Hemoglobin 11.2 (*)    HCT 33.5 (*)     All other components within normal limits  POC URINE PREG, ED - Abnormal; Notable for the following components:   Preg Test, Ur POSITIVE (*)    All other components within normal limits  ABO/RH     RADIOLOGY Ultrasound OB complete less than 14 weeks per radiologist shows a single IUP measuring 7 weeks and 6 days with a heart rate of 162.  Small subchorionic hemorrhage present.    PROCEDURES:  Critical Care performed:   Procedures   MEDICATIONS ORDERED IN ED: Medications - No data to display   IMPRESSION / MDM / ASSESSMENT AND PLAN / ED COURSE  I reviewed the triage vital signs and the nursing notes.   Differential diagnosis includes, but is not limited to, pregnancy complications, miscarriage, subchorionic hemorrhage,  33 year old female presents to the ED with complaint of vaginal spotting and positive pregnancy.  Ultrasound showed a single IUP at 7 weeks and 6 days with a heart  rate of 162.  Patient is uncertain at this time whether she plans to stay in the [place] area.  Information about Dr. Jean Rosenthal who is on-call for OB/GYN was given to her with instructions to call his office to make an appointment if she plans on staying in this area.  She is also encouraged to return to the emergency department if any severe worsening of her symptoms.      Patient's presentation is most consistent with acute complicated illness / injury requiring diagnostic workup.  FINAL CLINICAL IMPRESSION(S) / ED DIAGNOSES   Final diagnoses:  First trimester bleeding     Rx / DC Orders   ED Discharge Orders     None        Note:  This document was prepared using Dragon voice recognition software and may include unintentional dictation errors.   Tommi Rumps, PA-C 06/16/23 1116    Willy Eddy, MD 06/16/23 [phone]
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
